# Patient Record
Sex: Female | Born: 1970 | Race: Black or African American | Hispanic: No | Smoking: Never smoker
Health system: Southern US, Community
[De-identification: ages and names within clinical notes are randomized; demographics above are authoritative.]

## PROBLEM LIST (undated history)

## (undated) DIAGNOSIS — D219 Benign neoplasm of connective and other soft tissue, unspecified: Secondary | ICD-10-CM

## (undated) DIAGNOSIS — F419 Anxiety disorder, unspecified: Secondary | ICD-10-CM

## (undated) DIAGNOSIS — G5600 Carpal tunnel syndrome, unspecified upper limb: Secondary | ICD-10-CM

## (undated) DIAGNOSIS — K759 Inflammatory liver disease, unspecified: Secondary | ICD-10-CM

## (undated) DIAGNOSIS — M1990 Unspecified osteoarthritis, unspecified site: Secondary | ICD-10-CM

## (undated) HISTORY — PX: ABDOMINAL HYSTERECTOMY: SHX81

---

## 2001-09-09 HISTORY — PX: TUBAL LIGATION: SHX77

## 2008-05-31 ENCOUNTER — Emergency Department (HOSPITAL_BASED_OUTPATIENT_CLINIC_OR_DEPARTMENT_OTHER): Admission: EM | Admit: 2008-05-31 | Discharge: 2008-05-31 | Payer: Self-pay | Admitting: Emergency Medicine

## 2009-08-19 ENCOUNTER — Emergency Department (HOSPITAL_BASED_OUTPATIENT_CLINIC_OR_DEPARTMENT_OTHER): Admission: EM | Admit: 2009-08-19 | Discharge: 2009-08-19 | Payer: Self-pay | Admitting: Emergency Medicine

## 2010-09-09 DIAGNOSIS — D219 Benign neoplasm of connective and other soft tissue, unspecified: Secondary | ICD-10-CM

## 2010-09-09 HISTORY — DX: Benign neoplasm of connective and other soft tissue, unspecified: D21.9

## 2011-06-05 ENCOUNTER — Other Ambulatory Visit: Payer: Self-pay | Admitting: Obstetrics and Gynecology

## 2011-06-14 ENCOUNTER — Encounter (HOSPITAL_COMMUNITY)
Admission: RE | Admit: 2011-06-14 | Discharge: 2011-06-14 | Disposition: A | Discharge status: Home / Self Care | Payer: Managed Care, Other (non HMO) | Source: Ambulatory Visit | Attending: Obstetrics and Gynecology | Admitting: Obstetrics and Gynecology

## 2011-06-14 ENCOUNTER — Encounter (HOSPITAL_COMMUNITY): Payer: Self-pay

## 2011-06-14 HISTORY — DX: Anxiety disorder, unspecified: F41.9

## 2011-06-14 HISTORY — DX: Benign neoplasm of connective and other soft tissue, unspecified: D21.9

## 2011-06-14 LAB — SURGICAL PCR SCREEN
MRSA, PCR: NEGATIVE
Staphylococcus aureus: NEGATIVE

## 2011-06-14 LAB — CBC
Platelets: 185 10*3/uL (ref 150–400)
RBC: 4.18 MIL/uL (ref 3.87–5.11)
RDW: 12.6 % (ref 11.5–15.5)
WBC: 4.4 10*3/uL (ref 4.0–10.5)

## 2011-06-14 NOTE — Patient Instructions (Addendum)
   Your procedure is scheduled on: Monday, October 8 at 7:30am  Enter through the [employer] of [facility] at: 6:00am Pick up the phone at the desk and dial [phone] and inform us of your arrival  Please call this number if you have any problems the morning of surgery: [phone]  Remember: Do not eat food after midnight  Sunday Do not drink clear liquids after midnight: Sunday Take these medicines the morning of surgery with a SIP OF WATER:  Do not wear jewelry, make-up, or FINGER nail polish Do not wear lotions, powders, or perfumes.  You may wear deodorant. Do not shave 48 hours prior to surgery. Do not bring valuables to the hospital. Leave suitcase in the car. After Surgery it may be brought to your room. For patients being admitted to the hospital, checkout time is 11:00am the day of discharge.  Patients discharged on the day of surgery will not be allowed to drive home.   Name and phone number of your driver:   Remember to use your hibiclens as instructed. Please shower with 1/2 bottle the evening before your surgery and the other 1/2 bottle the morning of surgery.

## 2011-06-16 ENCOUNTER — Other Ambulatory Visit (HOSPITAL_COMMUNITY): Payer: Self-pay | Admitting: Obstetrics and Gynecology

## 2011-06-16 DIAGNOSIS — D259 Leiomyoma of uterus, unspecified: Secondary | ICD-10-CM | POA: Insufficient documentation

## 2011-06-16 DIAGNOSIS — N946 Dysmenorrhea, unspecified: Secondary | ICD-10-CM | POA: Insufficient documentation

## 2011-06-16 DIAGNOSIS — N920 Excessive and frequent menstruation with regular cycle: Secondary | ICD-10-CM

## 2011-06-16 DIAGNOSIS — N760 Acute vaginitis: Secondary | ICD-10-CM

## 2011-06-16 DIAGNOSIS — B9689 Other specified bacterial agents as the cause of diseases classified elsewhere: Secondary | ICD-10-CM

## 2011-06-16 MED ORDER — METRONIDAZOLE IN NACL 5-0.79 MG/ML-% IV SOLN: 500.0000 mg | Freq: Three times a day (TID) | INTRAVENOUS | Status: DC

## 2011-06-16 MED ORDER — CEFAZOLIN SODIUM-DEXTROSE 2-3 GM-% IV SOLR
2.0000 g | INTRAVENOUS | Status: AC
  Administered 2011-06-17: 2 g via INTRAVENOUS
  Filled 2011-06-16: qty 50

## 2011-06-17 ENCOUNTER — Ambulatory Visit (HOSPITAL_COMMUNITY): Payer: Managed Care, Other (non HMO) | Admitting: Anesthesiology

## 2011-06-17 ENCOUNTER — Encounter (HOSPITAL_COMMUNITY): Payer: Self-pay | Admitting: Anesthesiology

## 2011-06-17 ENCOUNTER — Other Ambulatory Visit: Payer: Self-pay | Admitting: Obstetrics and Gynecology

## 2011-06-17 ENCOUNTER — Ambulatory Visit (HOSPITAL_COMMUNITY)
Admission: RE | Admit: 2011-06-17 | Discharge: 2011-06-17 | Disposition: A | Discharge status: Home / Self Care | Payer: Managed Care, Other (non HMO) | Source: Ambulatory Visit | Attending: Obstetrics and Gynecology | Admitting: Obstetrics and Gynecology

## 2011-06-17 ENCOUNTER — Encounter (HOSPITAL_COMMUNITY): Payer: Self-pay | Admitting: *Deleted

## 2011-06-17 ENCOUNTER — Encounter (HOSPITAL_COMMUNITY)
Admission: RE | Disposition: A | Discharge status: Home / Self Care | Payer: Self-pay | Source: Ambulatory Visit | Attending: Obstetrics and Gynecology

## 2011-06-17 DIAGNOSIS — Z01818 Encounter for other preprocedural examination: Secondary | ICD-10-CM | POA: Insufficient documentation

## 2011-06-17 DIAGNOSIS — D259 Leiomyoma of uterus, unspecified: Secondary | ICD-10-CM | POA: Insufficient documentation

## 2011-06-17 DIAGNOSIS — N946 Dysmenorrhea, unspecified: Secondary | ICD-10-CM | POA: Insufficient documentation

## 2011-06-17 DIAGNOSIS — Z01812 Encounter for preprocedural laboratory examination: Secondary | ICD-10-CM | POA: Insufficient documentation

## 2011-06-17 DIAGNOSIS — N920 Excessive and frequent menstruation with regular cycle: Secondary | ICD-10-CM | POA: Insufficient documentation

## 2011-06-17 DIAGNOSIS — Z90710 Acquired absence of both cervix and uterus: Secondary | ICD-10-CM

## 2011-06-17 LAB — CBC
HCT: 33.4 % — ABNORMAL LOW (ref 36.0–46.0)
MCH: 30 pg (ref 26.0–34.0)
MCHC: 33.8 g/dL (ref 30.0–36.0)
RDW: 12.6 % (ref 11.5–15.5)

## 2011-06-17 LAB — BASIC METABOLIC PANEL
BUN: 5 mg/dL — ABNORMAL LOW (ref 6–23)
Calcium: 8.6 mg/dL (ref 8.4–10.5)
GFR calc Af Amer: >90 mL/min (ref >90)
GFR calc non Af Amer: 79 mL/min — ABNORMAL LOW (ref >90)
Potassium: 4 mEq/L (ref 3.5–5.1)
Sodium: 133 mEq/L — ABNORMAL LOW (ref 135–145)

## 2011-06-17 LAB — PREGNANCY, URINE: Preg Test, Ur: NEGATIVE

## 2011-06-17 SURGERY — ROBOTIC ASSISTED TOTAL HYSTERECTOMY
Anesthesia: General | Site: Uterus | Wound class: Clean Contaminated

## 2011-06-17 MED ORDER — FLUOXETINE HCL 20 MG PO CAPS
20.0000 mg | ORAL_CAPSULE | Freq: Every day | ORAL | Status: DC
  Filled 2011-06-17 (×2): qty 1

## 2011-06-17 MED ORDER — HYDROCODONE-ACETAMINOPHEN 5-325 MG PO TABS: 1.0000 | ORAL_TABLET | ORAL | Status: DC | PRN

## 2011-06-17 MED ORDER — DEXAMETHASONE SODIUM PHOSPHATE 4 MG/ML IJ SOLN
INTRAMUSCULAR | Status: DC | PRN
  Administered 2011-06-17: 5 mg via INTRAVENOUS

## 2011-06-17 MED ORDER — KETOROLAC TROMETHAMINE 30 MG/ML IJ SOLN
INTRAMUSCULAR | Status: AC
  Filled 2011-06-17: qty 1

## 2011-06-17 MED ORDER — HYDROMORPHONE HCL 2 MG PO TABS
4.0000 mg | ORAL_TABLET | ORAL | Status: DC | PRN
  Administered 2011-06-17: 4 mg via ORAL
  Filled 2011-06-17: qty 2

## 2011-06-17 MED ORDER — SIMETHICONE 80 MG PO CHEW: 80.0000 mg | CHEWABLE_TABLET | Freq: Four times a day (QID) | ORAL | Status: DC | PRN

## 2011-06-17 MED ORDER — SCOPOLAMINE 1 MG/3DAYS TD PT72
MEDICATED_PATCH | TRANSDERMAL | Status: AC
  Administered 2011-06-17: 1.5 mg via TRANSDERMAL
  Filled 2011-06-17: qty 1

## 2011-06-17 MED ORDER — ROCURONIUM BROMIDE 50 MG/5ML IV SOLN
INTRAVENOUS | Status: AC
  Filled 2011-06-17: qty 1

## 2011-06-17 MED ORDER — SUFENTANIL CITRATE 50 MCG/ML IV SOLN
INTRAVENOUS | Status: AC
  Filled 2011-06-17: qty 1

## 2011-06-17 MED ORDER — ALUM & MAG HYDROXIDE-SIMETH 200-200-20 MG/5ML PO SUSP: 30.0000 mL | ORAL | Status: DC | PRN

## 2011-06-17 MED ORDER — SCOPOLAMINE 1 MG/3DAYS TD PT72
1.0000 | MEDICATED_PATCH | Freq: Once | TRANSDERMAL | Status: DC
  Administered 2011-06-17: 1.5 mg via TRANSDERMAL

## 2011-06-17 MED ORDER — HYDROMORPHONE HCL 1 MG/ML IJ SOLN
INTRAMUSCULAR | Status: AC
  Filled 2011-06-17: qty 1

## 2011-06-17 MED ORDER — LACTATED RINGERS IR SOLN
Status: DC | PRN
  Administered 2011-06-17: 3000 mL

## 2011-06-17 MED ORDER — ONDANSETRON HCL 4 MG/2ML IJ SOLN: 4.0000 mg | Freq: Four times a day (QID) | INTRAMUSCULAR | Status: DC | PRN

## 2011-06-17 MED ORDER — KETOROLAC TROMETHAMINE 30 MG/ML IJ SOLN
INTRAMUSCULAR | Status: DC | PRN
  Administered 2011-06-17: 30 mg via INTRAVENOUS

## 2011-06-17 MED ORDER — SENNOSIDES-DOCUSATE SODIUM 8.6-50 MG PO TABS: 2.0000 | ORAL_TABLET | Freq: Every day | ORAL | Status: DC | PRN

## 2011-06-17 MED ORDER — ALPRAZOLAM 0.25 MG PO TABS: 0.2500 mg | ORAL_TABLET | Freq: Every day | ORAL | Status: DC | PRN

## 2011-06-17 MED ORDER — LIDOCAINE HCL (CARDIAC) 20 MG/ML IV SOLN
INTRAVENOUS | Status: AC
  Filled 2011-06-17: qty 5

## 2011-06-17 MED ORDER — FENTANYL CITRATE 0.05 MG/ML IJ SOLN
INTRAMUSCULAR | Status: AC
  Filled 2011-06-17: qty 2

## 2011-06-17 MED ORDER — PROPOFOL 10 MG/ML IV EMUL
INTRAVENOUS | Status: AC
  Filled 2011-06-17: qty 20

## 2011-06-17 MED ORDER — PANTOPRAZOLE SODIUM 40 MG PO TBEC
40.0000 mg | DELAYED_RELEASE_TABLET | Freq: Every day | ORAL | Status: DC
  Filled 2011-06-17 (×2): qty 1

## 2011-06-17 MED ORDER — METRONIDAZOLE IN NACL 5-0.79 MG/ML-% IV SOLN
500.0000 mg | INTRAVENOUS | Status: AC
  Administered 2011-06-17 (×2): 500 mg via INTRAVENOUS
  Filled 2011-06-17: qty 100

## 2011-06-17 MED ORDER — BUPIVACAINE HCL (PF) 0.25 % IJ SOLN
INTRAMUSCULAR | Status: DC | PRN
  Administered 2011-06-17: 10 mL

## 2011-06-17 MED ORDER — DEXTROSE IN LACTATED RINGERS 5 % IV SOLN: INTRAVENOUS | Status: DC

## 2011-06-17 MED ORDER — NEOSTIGMINE METHYLSULFATE 1 MG/ML IJ SOLN
INTRAMUSCULAR | Status: AC
  Filled 2011-06-17: qty 10

## 2011-06-17 MED ORDER — ROCURONIUM BROMIDE 100 MG/10ML IV SOLN
INTRAVENOUS | Status: DC | PRN
  Administered 2011-06-17: 5 mg via INTRAVENOUS
  Administered 2011-06-17: 40 mg via INTRAVENOUS

## 2011-06-17 MED ORDER — ACETAMINOPHEN 10 MG/ML IV SOLN
1000.0000 mg | Freq: Once | INTRAVENOUS | Status: AC | PRN
  Administered 2011-06-17: 1000 mg via INTRAVENOUS
  Filled 2011-06-17: qty 100

## 2011-06-17 MED ORDER — BISACODYL 10 MG RE SUPP: 10.0000 mg | Freq: Every day | RECTAL | Status: DC | PRN

## 2011-06-17 MED ORDER — SUFENTANIL CITRATE 50 MCG/ML IV SOLN
INTRAVENOUS | Status: DC | PRN
  Administered 2011-06-17: 10 ug via INTRAVENOUS
  Administered 2011-06-17: 5 ug via INTRAVENOUS
  Administered 2011-06-17 (×2): 10 ug via INTRAVENOUS

## 2011-06-17 MED ORDER — MENTHOL 3 MG MT LOZG: 1.0000 | LOZENGE | OROMUCOSAL | Status: DC | PRN

## 2011-06-17 MED ORDER — NEOSTIGMINE METHYLSULFATE 1 MG/ML IJ SOLN
INTRAMUSCULAR | Status: DC | PRN
  Administered 2011-06-17: 3 mg via INTRAVENOUS

## 2011-06-17 MED ORDER — MIDAZOLAM HCL 5 MG/5ML IJ SOLN
INTRAMUSCULAR | Status: DC | PRN
  Administered 2011-06-17: .5 mg via INTRAVENOUS
  Administered 2011-06-17: 1.5 mg via INTRAVENOUS

## 2011-06-17 MED ORDER — OXYCODONE-ACETAMINOPHEN 5-325 MG PO TABS
1.0000 | ORAL_TABLET | ORAL | Status: DC | PRN
  Administered 2011-06-17: 2 via ORAL
  Filled 2011-06-17: qty 2

## 2011-06-17 MED ORDER — MIDAZOLAM HCL 2 MG/2ML IJ SOLN
INTRAMUSCULAR | Status: AC
  Filled 2011-06-17: qty 2

## 2011-06-17 MED ORDER — PHENYLEPHRINE HCL 10 MG/ML IJ SOLN
INTRAMUSCULAR | Status: DC | PRN
  Administered 2011-06-17: 80 mg via INTRAVENOUS
  Administered 2011-06-17: 40 mg via INTRAVENOUS
  Administered 2011-06-17: 80 mg via INTRAVENOUS

## 2011-06-17 MED ORDER — FENTANYL CITRATE 0.05 MG/ML IJ SOLN
25.0000 ug | INTRAMUSCULAR | Status: DC | PRN
  Administered 2011-06-17 (×2): 25 ug via INTRAVENOUS

## 2011-06-17 MED ORDER — METRONIDAZOLE 500 MG PO TABS
500.0000 mg | ORAL_TABLET | Freq: Two times a day (BID) | ORAL | Status: DC
  Filled 2011-06-17 (×2): qty 1

## 2011-06-17 MED ORDER — ZOLPIDEM TARTRATE 5 MG PO TABS: 5.0000 mg | ORAL_TABLET | Freq: Every evening | ORAL | Status: DC | PRN

## 2011-06-17 MED ORDER — ONDANSETRON HCL 4 MG/2ML IJ SOLN
INTRAMUSCULAR | Status: AC
  Filled 2011-06-17: qty 2

## 2011-06-17 MED ORDER — LACTATED RINGERS IV SOLN
INTRAVENOUS | Status: DC
  Administered 2011-06-17 (×3): via INTRAVENOUS

## 2011-06-17 MED ORDER — DOCUSATE SODIUM 100 MG PO CAPS: 100.0000 mg | ORAL_CAPSULE | Freq: Every day | ORAL | Status: DC

## 2011-06-17 MED ORDER — PROPOFOL 10 MG/ML IV EMUL
INTRAVENOUS | Status: DC | PRN
  Administered 2011-06-17: 100 mg via INTRAVENOUS

## 2011-06-17 MED ORDER — CLINDAMYCIN HCL 300 MG PO CAPS: 300.0000 mg | ORAL_CAPSULE | Freq: Two times a day (BID) | ORAL | Status: AC

## 2011-06-17 MED ORDER — HYDROMORPHONE HCL 1 MG/ML IJ SOLN
INTRAMUSCULAR | Status: DC | PRN
  Administered 2011-06-17 (×2): .5 mg via INTRAVENOUS

## 2011-06-17 MED ORDER — GLYCOPYRROLATE 0.2 MG/ML IJ SOLN
INTRAMUSCULAR | Status: DC | PRN
  Administered 2011-06-17: .4 mg via INTRAVENOUS

## 2011-06-17 MED ORDER — LIDOCAINE HCL (CARDIAC) 20 MG/ML IV SOLN
INTRAVENOUS | Status: DC | PRN
  Administered 2011-06-17: 60 mg via INTRAVENOUS

## 2011-06-17 MED ORDER — CLINDAMYCIN HCL 300 MG PO CAPS
300.0000 mg | ORAL_CAPSULE | Freq: Two times a day (BID) | ORAL | Status: DC
  Filled 2011-06-17: qty 1

## 2011-06-17 MED ORDER — GLYCOPYRROLATE 0.2 MG/ML IJ SOLN
INTRAMUSCULAR | Status: AC
  Filled 2011-06-17: qty 2

## 2011-06-17 MED ORDER — ONDANSETRON HCL 4 MG PO TABS: 4.0000 mg | ORAL_TABLET | Freq: Four times a day (QID) | ORAL | Status: DC | PRN

## 2011-06-17 MED ORDER — DEXAMETHASONE SODIUM PHOSPHATE 10 MG/ML IJ SOLN
INTRAMUSCULAR | Status: AC
  Filled 2011-06-17: qty 1

## 2011-06-17 MED ORDER — METRONIDAZOLE IN NACL 5-0.79 MG/ML-% IV SOLN
500.0000 mg | Freq: Three times a day (TID) | INTRAVENOUS | Status: DC
  Filled 2011-06-17 (×5): qty 100

## 2011-06-17 MED ORDER — ONDANSETRON HCL 4 MG/2ML IJ SOLN
INTRAMUSCULAR | Status: DC | PRN
  Administered 2011-06-17: 2 mg via INTRAVENOUS

## 2011-06-17 MED ORDER — HYDROMORPHONE HCL 4 MG PO TABS: 4.0000 mg | ORAL_TABLET | ORAL | Status: AC | PRN

## 2011-06-17 SURGICAL SUPPLY — 53 items
BAG URINE DRAINAGE (UROLOGICAL SUPPLIES) ×3 IMPLANT
BARRIER ADHS 3X4 INTERCEED (GAUZE/BANDAGES/DRESSINGS) ×3 IMPLANT
CABLE HIGH FREQUENCY MONO STRZ (ELECTRODE) ×3 IMPLANT
CATH FOLEY 3WAY  5CC 16FR (CATHETERS) ×1
CATH FOLEY 3WAY 5CC 16FR (CATHETERS) ×2 IMPLANT
CHLORAPREP W/TINT 26ML (MISCELLANEOUS) ×9 IMPLANT
CONT PATH 16OZ SNAP LID 3702 (MISCELLANEOUS) ×3 IMPLANT
COVER MAYO STAND STRL (DRAPES) ×3 IMPLANT
COVER TABLE BACK 60X90 (DRAPES) ×6 IMPLANT
COVER TIP SHEARS 8 DVNC (MISCELLANEOUS) ×2 IMPLANT
COVER TIP SHEARS 8MM DA VINCI (MISCELLANEOUS) ×1
DECANTER SPIKE VIAL GLASS SM (MISCELLANEOUS) ×3 IMPLANT
DERMABOND ADVANCED (GAUZE/BANDAGES/DRESSINGS) ×1
DERMABOND ADVANCED .7 DNX12 (GAUZE/BANDAGES/DRESSINGS) ×2 IMPLANT
DRAPE HUG U DISPOSABLE (DRAPE) ×3 IMPLANT
DRAPE LG THREE QUARTER DISP (DRAPES) ×6 IMPLANT
DRAPE MONITOR DA VINCI (DRAPE) ×3 IMPLANT
DRAPE WARM FLUID 44X44 (DRAPE) ×3 IMPLANT
ELECT REM PT RETURN 9FT ADLT (ELECTROSURGICAL) ×3
ELECTRODE REM PT RTRN 9FT ADLT (ELECTROSURGICAL) ×2 IMPLANT
EVACUATOR SMOKE 8.L (FILTER) ×3 IMPLANT
GLOVE BIO SURGEON STRL SZ 6.5 (GLOVE) ×6 IMPLANT
GLOVE BIOGEL PI IND STRL 7.0 (GLOVE) ×10 IMPLANT
GLOVE BIOGEL PI INDICATOR 7.0 (GLOVE) ×5
GOWN PREVENTION PLUS LG XLONG (DISPOSABLE) ×6 IMPLANT
GYRUS RUMI II 3.5CM BLUE (DISPOSABLE) ×3
GYRUS RUMI II 4.0CM BLUE (DISPOSABLE) ×3
HEMOSTAT SURGICEL 2X14 (HEMOSTASIS) ×3 IMPLANT
KIT DISP ACCESSORY 4 ARM (KITS) ×3 IMPLANT
NEEDLE INSUFFLATION 14GA 120MM (NEEDLE) ×3 IMPLANT
NS IRRIG 1000ML POUR BTL (IV SOLUTION) ×9 IMPLANT
PACK LAVH (CUSTOM PROCEDURE TRAY) ×3 IMPLANT
PAD PREP 24X48 CUFFED NSTRL (MISCELLANEOUS) ×6 IMPLANT
RUMI II GYRUS 3.5CM BLUE (DISPOSABLE) ×2 IMPLANT
RUMI II GYRUS 4.0CM BLUE (DISPOSABLE) ×2 IMPLANT
SCISSORS LAP 5X35 DISP (ENDOMECHANICALS) IMPLANT
SET IRRIG TUBING LAPAROSCOPIC (IRRIGATION / IRRIGATOR) ×3 IMPLANT
SHEET LAVH (DRAPES) ×3 IMPLANT
SOLUTION ELECTROLUBE (MISCELLANEOUS) ×3 IMPLANT
SUT VIC AB 0 CT1 27 (SUTURE) ×5
SUT VIC AB 0 CT1 27XBRD ANTBC (SUTURE) ×10 IMPLANT
SUT VIC AB 0 CT2 27 (SUTURE) ×9 IMPLANT
SUT VICRYL 0 UR6 27IN ABS (SUTURE) ×3 IMPLANT
SUT VICRYL 4-0 PS2 18IN ABS (SUTURE) ×6 IMPLANT
SYR 50ML LL SCALE MARK (SYRINGE) ×3 IMPLANT
TIP UTERINE 6.7X6CM WHT DISP (MISCELLANEOUS) ×3 IMPLANT
TOWEL OR 17X24 6PK STRL BLUE (TOWEL DISPOSABLE) ×9 IMPLANT
TROCAR DISP BLADELESS 8 DVNC (TROCAR) ×2 IMPLANT
TROCAR DISP BLADELESS 8MM (TROCAR) ×1
TROCAR XCEL NON-BLD 5MMX100MML (ENDOMECHANICALS) ×3 IMPLANT
TROCAR Z-THREAD 12X150 (TROCAR) ×3 IMPLANT
TUBING FILTER THERMOFLATOR (ELECTROSURGICAL) ×3 IMPLANT
WARMER LAPAROSCOPE (MISCELLANEOUS) ×3 IMPLANT

## 2011-06-17 NOTE — Progress Notes (Signed)
Pt in 3rd floor recovery lounge (Room 302) for 30 minutes.  Pt c/o pain 6/10 in lower abdomen unrelieved by 1000 mg IV acetaminophen and fentanyl - both given in PACU.  Vicodin and Percocet ordered for pt post-op.  Phoned Dr Cherly Hensen to ask about a pain medication without acetaminophen.  Dr Cherly Hensen stated the Percocet would be fine and to administer as needed.  Phoned Mariann Barter, RPh in pharmacy to ask about acetaminophen dosing.  He stated 4 gram limit in 24 hours and generally no more than 1 gram in 6 hours.  Also stated that if pt has a healthy liver and MD is aware of dosing, then probably OK to proceed with medication as ordered - being sure to keep track of 24 hour limit.

## 2011-06-17 NOTE — Anesthesia Preprocedure Evaluation (Addendum)
Anesthesia Evaluation  Name, MR# and DOB Patient awake  General Assessment Comment  Reviewed: Allergy & Precautions, H&P , NPO status , Patient's Chart, lab work & pertinent test results  Airway Mallampati: I TM Distance: >3 FB Neck ROM: full    Dental No notable dental hx. (+) Teeth Intact   Pulmonary    Pulmonary exam normal       Cardiovascular     Neuro/Psych PSYCHIATRIC DISORDERS Anxiety Negative Neurological ROS     GI/Hepatic negative GI ROS Neg liver ROS    Endo/Other  Negative Endocrine ROS  Renal/GU negative Renal ROS  Genitourinary negative   Musculoskeletal negative musculoskeletal ROS (+)   Abdominal Normal abdominal exam  (+)   Peds  Hematology negative hematology ROS (+)   Anesthesia Other Findings   Reproductive/Obstetrics negative OB ROS                           Anesthesia Physical Anesthesia Plan  ASA: II  Anesthesia Plan: General   Post-op Pain Management:    Induction: Intravenous  Airway Management Planned: Oral ETT  Additional Equipment:   Intra-op Plan:   Post-operative Plan: Extubation in OR  Informed Consent: I have reviewed the patients History and Physical, chart, labs and discussed the procedure including the risks, benefits and alternatives for the proposed anesthesia with the patient or authorized representative who has indicated his/her understanding and acceptance.   Dental Advisory Given  Plan Discussed with: CRNA  Anesthesia Plan Comments:        Anesthesia Quick Evaluation

## 2011-06-17 NOTE — Transfer of Care (Signed)
Immediate Anesthesia Transfer of Care Note  Patient: Sylvia Acevedo  Procedure(s) Performed:  ROBOTIC ASSISTED TOTAL HYSTERECTOMY; BILATERAL SALPINGECTOMY  Patient Location: PACU  Anesthesia Type: General  Level of Consciousness: awake, alert  and oriented  Airway & Oxygen Therapy: Patient Spontanous Breathing and Patient connected to nasal cannula oxygen  Post-op Assessment: Report given to PACU RN, Post -op Vital signs reviewed and stable and Patient moving all extremities X 4  Post vital signs: Reviewed and stable  Complications: No apparent anesthesia complications

## 2011-06-17 NOTE — Progress Notes (Signed)
Pt discharged to home with mother and sister.    Condition stable.  Pt to car via wheelchair with Fulton Reek, NT.  No equipment ordered at discharge.

## 2011-06-17 NOTE — Brief Op Note (Signed)
06/17/2011  11:36 AM  PATIENT:  Sylvia Acevedo  40 y.o. female  PRE-OPERATIVE DIAGNOSIS:  Menorrhagia, Uterine Fibroids, dysmenorrhea  POST-OPERATIVE DIAGNOSIS:  Menorrhagia, Uterine Fibroids, dysmenorrhea  PROCEDURE:  Procedure(s): DaVinci ROBOTIC ASSISTED TOTAL HYSTERECTOMY BILATERAL SALPINGECTOMY  SURGEON:  Surgeon(s): Serita Kyle, MD Alfredia Ferguson Mody  PHYSICIAN ASSISTANT:   ASSISTANTS: Vaishali mody, MD  ANESTHESIA:   general  OR FLUID I/O:  Total I/O In: 1400 [I.V.:1400] Out: 275 [Urine:200; Blood:75]  BLOOD ADMINISTERED:none  DRAINS: none   LOCAL MEDICATIONS USED:  OTHER marcaine 7 cc  Findings: fibroid uterus w/ large left 6-7 cm pedunculated fundal fibroid, surgically separated tubes bilat, nl ovaries ( left one w/ CLC, nl appendix, nl liver edge, no evidence of endometriosis, nl ureters bilaterally  SPECIMEN:  Source of Specimen:  uterus w. cervix and tubes  DISPOSITION OF SPECIMEN:  PATHOLOGY  COUNTS:  YES  TOURNIQUET:  * No tourniquets in log *  DICTATION: .Other Dictation: Dictation Number [id number]  PLAN OF CARE: Discharge to home after PACU  PATIENT DISPOSITION:  PACU - hemodynamically stable.   Delay start of Pharmacological VTE agent (>24hrs) due to surgical blood loss or risk of bleeding:  no

## 2011-06-17 NOTE — Anesthesia Postprocedure Evaluation (Signed)
Anesthesia Post Note  Patient: Sylvia Acevedo  Procedure(s) Performed:  ROBOTIC ASSISTED TOTAL HYSTERECTOMY; BILATERAL SALPINGECTOMY  Anesthesia type: General  Patient location: PACU  Post pain: Pain level controlled  Post assessment: Post-op Vital signs reviewed  Last Vitals:  Filed Vitals:   06/17/11 1300  BP:   Pulse:   Temp:   Resp: 16    Post vital signs: Reviewed  Level of consciousness: sedated  Complications: No apparent anesthesia complications

## 2011-06-17 NOTE — Preoperative (Signed)
Beta Blockers   Reason not to administer Beta Blockers:Not Applicable 

## 2011-06-17 NOTE — Discharge Summary (Signed)
Physician Discharge Summary  Patient ID: Sylvia Acevedo MRN: [id number] DOB/AGE: [birth year] 40 y.o.  Admit date: 06/17/2011 Discharge date: 06/17/2011  Admission Diagnoses: menorrhagia, dysmenorrhea, uterine fibroid  Discharge Diagnoses: menorrhagia, dysmenorrhea, uterine fibroid Active Problems:  * No active hospital problems. *   Procedure: DaVinci TLH, bilateral salpingectomy Discharged Condition: stable  Hospital Course: uncomplicated postoperative course.  Consults: none  Significant Diagnostic Studies: labs:  hct 33.4, hgb 11.3 wbc 11.8 plt 133k  creatinine 0.91 Treatments: surgery: Davinci robotic TLH, bilateral salpingectomy  Discharge Exam: Blood pressure 109/73, pulse 89, temperature 98 F (36.7 C), temperature source Oral, resp. rate 18, height 5\' 2"  (1.575 m), weight 129 lb (58.514 kg), SpO2 100.00%. General appearance: alert, cooperative and no distress Resp: clear to auscultation bilaterally Cardio: regular rate and rhythm, S1, S2 normal, no murmur, click, rub or gallop GI: soft, non-tender; bowel sounds normal; no masses,  no organomegaly and incision dry/intact/clean Pelvic: deferred Extremities: no edema, redness or tenderness in the calves or thighs  Disposition: Final discharge disposition not confirmed  Discharge Orders    Future Orders Please Complete By Expires   Diet general      Increase activity slowly      Discharge instructions      Comments:   Call if temperature greater than equal to 100.4, nothing per vagina for 4-6 weeks or severe nausea vomiting, increased incisional pain , drainage or redness in the incision site, no straining with bowel movements, showers no bath  No straining with bowel movement   Leave dressing on - Keep it clean, dry, and intact until clinic visit      May walk up steps        Current Discharge Medication List    START taking these medications   Details  clindamycin (CLEOCIN) 300 MG capsule Take 1 capsule (300 mg  total) by mouth 2 times daily at 12 noon and 4 pm. Qty: 14 capsule, Refills: 0    HYDROmorphone (DILAUDID) 4 MG tablet Take 1 tablet (4 mg total) by mouth every 4 (four) hours as needed. Qty: 30 tablet, Refills: 0      CONTINUE these medications which have NOT CHANGED   Details  ALPRAZolam (XANAX) 0.5 MG tablet Take 0.25 mg by mouth daily as needed. For anxiety     FLUoxetine (PROZAC) 20 MG capsule Take 20 mg by mouth daily.      traMADol (ULTRAM) 50 MG tablet Take 50 mg by mouth every 6 (six) hours as needed. For pain       STOP taking these medications     HYDROcodone-acetaminophen (VICODIN) 5-500 MG per tablet        Follow-up Information    Follow up with Mehul Rudin A, MD in 2 weeks.   Contact information:   [address] [place] [address] [phone]          Signed: Serita Kyle 06/17/2011, 6:16 PM

## 2011-06-18 NOTE — Op Note (Signed)
NAMEELORAH, DEWING               ACCOUNT NO.:  [id number]  MEDICAL RECORD NO.:  [id number]  LOCATION:  9302                          FACILITY:  [facility]  PHYSICIAN:  Maxie Better, M.D.DATE OF BIRTH:  [birth year]  DATE OF PROCEDURE:  06/17/2011 DATE OF DISCHARGE:  06/17/2011                              OPERATIVE REPORT   PREOPERATIVE DIAGNOSES: 1. Menorrhagia. 2. Uterine fibroids. 3. Dysmenorrhea.  PROCEDURES: 1. Da Vinci robotic total hysterectomy. 2. Bilateral salpingectomy.  POSTOPERATIVE DIAGNOSES: 1. Menorrhagia. 2. Uterine fibroids. 3. Dysmenorrhea.  ANESTHESIA:  General.  SURGEON:  Maxie Better, MD  ASSISTANT:  Darryl Nestle, MD  DESCRIPTION OF PROCEDURE:  Under adequate general anesthesia, the patient was placed in the dorsal lithotomy position.  Examination under anesthesia revealed about a 10-12-week size uterus with a palpable mass in the left fundal area.  The patient was positioned for robotic surgery.  She was sterilely prepped with ChloraPrep and Betadine. Indwelling Foley catheter was sterilely placed.  She was draped.  A weighted speculum was placed in the vagina.  Sims retractors used anteriorly.  The cervix was large, parous, and the anterior-posterior lip of the cervix was grasped and single figure-of-eight sutures was placed in the anterior posterior lip, the uterus sounded to 7 cm.  The measurements of the cervix corresponded to 3.5 KOH ring.  A #6 introducer with the KOH ring was inserted.  Balloon inflated, however, the ring after placement was not seated well around the cervix and was increased to the 4.0 KOH RUMI ring.  The weighted speculum and retractor were then removed.  Attention was then turned to the abdomen.  A vertical supraumbilical incision was made.  Veress needle was introduced, tested with normal saline.  Carbon dioxide was insufflated after opening pressure of 4 was noted.  A 3 L of CO2 was insufflated. Veress needle  was then removed.  A 12-mm disposable trocar with sleeve was introduced in to the abdomen without incident.  The robotic camera was then inserted through that site.  Pelvis was inspected.  Upper liver edge was normal.  Appendix was noted to be normal.  Uterus was enlarged with at least 6-cm pedunculated left fundal fibroid.  The patient was placed in Trendelenburg.  The both tubes were evidence of prior surgical interruption and ovaries were normal bilaterally.  No endometriosis in the anterior or posterior cul-de-sac noted.  At that point, decision was then made to proceed with a robotic surgery.  Two 8-mm size ports were placed on the left and spread away from each other and slightly medial to the iliac crest.  On the right hand side, an 8-mm port was placed and a 5-mm assistant port was placed, all of which under direct visualization.  At that point, the robot was brought to these port sites and attached to the robotic instruments.  The PK, a Pro grasper, and monopolar scissors were then inserted and placed in the midline.  I then went to the surgical console, starting on using the third arm with a Pro grasper.  The uterus with the fibroid was deviated to the patient's right.  The utero-ovarian ligament on the right was serially clamped, cauterized, and cut as  well as the round ligament on that side, carried down to where the bladder peritoneum was noted and anterior leaf of the bladder was partially opened on that side.  The uterine vessels were subsequently noted, cauterized, but not cut.  The uterus was then placed in the midline.  The anterior cul-de-sac and the RUMI ring was placed, such that the bladder could be seen.  A vesicouterine peritoneum was opened transversely and blunt dissection was then performed.  The uterus was then shifted to the opposite side, where there was evidence of shortened right uteroovarian ligament.  The round ligament was first cauterized and then  subsequently cut, opening further the vesicouterine peritoneum on the right-hand side, and the bladder was then sharply dissected off the lower uterine segment and off the KOH ring and displaced inferiorly.  The right uteroovarian ligament then serially clamped, cauterized, and cut, carried down to the level of the uterine vessels on the right, was then serially clamped, cauterized, and ultimately cut.  Once the vessels were secured on the right, part of the anterior vagina was then opened and carried around to the right-hand side.  Attention was then turned back to the left.  Further cauterization of the uterine vessels was continued, subsequently that area was opened sharply and the cervix was subsequently detached from its vaginal attachment circumferentially.  Once this was done, the uterus with the fibroids were pulled through the vagina.  The specimen weighed 255 g.  The vaginal cuff was inspected, bleeders cauterized. Subsequently, the vaginal cuff was closed with interrupted 0 Vicryl figure-of-eight sutures.  Attention was then turned back to the adnexas. Both fallopian tubes were planned to be removed, starting on the left- hand side under the mesosalpinx, that area was clamped, cauterized, and then subsequently cut serially until the left tube was removed.  The same procedure was performed on the contralateral side.  Both ureters were seen to be peristalsing nicely during the case and and at end of case.  The tubes were then removed through the ports.  Abdomen was copiously irrigated and small bleeders cauterized with good hemostasis noted. Decision was then made to undock the robot.  The pelvis was then again inspected, irrigated, suctioned, small bleeders cauterized, and Surgicel was placed at the vaginal cuff.  Both adnexa were otherwise unremarkable.  At that point, the ports were removed under direct visualization.  The supraumbilical port was then removed and 12-mm  site was closed with facial stitches 0 Vicryl and the incision was closed with subcuticular 4-0 Vicryl sutures followed by Dermabond on all sites. The bimanual exam was done at the end of the case to confirm that the vaginal cuff was intact and well approximated, and it was.  SPECIMENS:  The uterus with fibroid and both fallopian tubes sent to Pathology.  ESTIMATED BLOOD LOSS:  75 mL.  INTRAOPERATIVE FLUID:  1400 mL.  URINE OUTPUT:  200 mL.  Sponge and instrument counts x2 was correct.  COMPLICATION:  None.  The patient tolerated the procedure well and was transferred to recovery room in stable condition.     Maxie Better, M.D.     [place]/MEDQ  D:  06/17/2011  T:  06/18/2011  Job:  [id number]

## 2011-06-19 NOTE — Progress Notes (Signed)
Encounter addended by: Jo-Ann Johanning C Houa Nie on: 06/19/2011  1:09 PM<BR>     Documentation filed: Notes Section

## 2011-06-19 NOTE — Anesthesia Postprocedure Evaluation (Incomplete)
  Anesthesia Post-op Note  Patient: Sylvia Acevedo  Procedure(s) Performed:  ROBOTIC ASSISTED TOTAL HYSTERECTOMY; BILATERAL SALPINGECTOMY  Patient Location: {PLACES; ANE POST:19477::"PACU"}  Anesthesia Type: {PROCEDURES; ANE POST ANESTHESIA TYPE:19480}  Level of Consciousness: {FINDINGS; ANE POST LEVEL OF CONSCIOUSNESS:19484}  Airway and Oxygen Therapy: {Exam; oxygen device:30095}  Post-op Pain: {Desc; pain severity:12299}  Post-op Assessment: {ASSESSMENT; ANE ZOXW:96045}  Post-op Vital Signs: {DESC; ANE POST WUJWJX:91478}  Complications: {FINDINGS; ANE POST COMPLICATIONS:19485}

## 2011-10-31 ENCOUNTER — Encounter (HOSPITAL_BASED_OUTPATIENT_CLINIC_OR_DEPARTMENT_OTHER): Payer: Self-pay

## 2011-10-31 ENCOUNTER — Emergency Department (HOSPITAL_BASED_OUTPATIENT_CLINIC_OR_DEPARTMENT_OTHER)
Admission: EM | Admit: 2011-10-31 | Discharge: 2011-10-31 | Disposition: A | Discharge status: Home / Self Care | Payer: Managed Care, Other (non HMO) | Attending: Emergency Medicine | Admitting: Emergency Medicine

## 2011-10-31 DIAGNOSIS — F411 Generalized anxiety disorder: Secondary | ICD-10-CM | POA: Insufficient documentation

## 2011-10-31 DIAGNOSIS — R110 Nausea: Secondary | ICD-10-CM | POA: Insufficient documentation

## 2011-10-31 MED ORDER — SODIUM CHLORIDE 0.9 % IV SOLN
INTRAVENOUS | Status: AC
  Filled 2011-10-31: qty 50

## 2011-10-31 MED ORDER — DEXAMETHASONE SODIUM PHOSPHATE 10 MG/ML IJ SOLN
10.0000 mg | Freq: Once | INTRAMUSCULAR | Status: AC
  Administered 2011-10-31: 10 mg via INTRAVENOUS
  Filled 2011-10-31: qty 1

## 2011-10-31 MED ORDER — SODIUM CHLORIDE 0.9 % IV BOLUS (SEPSIS)
500.0000 mL | Freq: Once | INTRAVENOUS | Status: AC
  Administered 2011-10-31: 500 mL via INTRAVENOUS

## 2011-10-31 MED ORDER — DIPHENHYDRAMINE HCL 50 MG/ML IJ SOLN
25.0000 mg | Freq: Once | INTRAMUSCULAR | Status: AC
  Administered 2011-10-31: 25 mg via INTRAVENOUS
  Filled 2011-10-31: qty 1

## 2011-10-31 MED ORDER — METOCLOPRAMIDE HCL 5 MG/ML IJ SOLN
10.0000 mg | Freq: Once | INTRAMUSCULAR | Status: AC
  Administered 2011-10-31: 10 mg via INTRAVENOUS
  Filled 2011-10-31: qty 2

## 2011-10-31 NOTE — ED Notes (Signed)
Pt c/o migraine since fri night. Pt was seen by PMD on mon given medications without relief. Pt c/o tightness down right side of neck, pain in right posterior head, blurred vision and migraines. Pt states symptoms are consistent with previous migraines.

## 2011-10-31 NOTE — ED Provider Notes (Signed)
History     CSN: [id number]  Arrival date & time 10/31/11  1903   First MD Initiated Contact with Patient 10/31/11 2052      Chief Complaint  Patient presents with  . Migraine    (Consider location/radiation/quality/duration/timing/severity/associated sxs/prior treatment) HPI Comments: Patient presents with migraine for the last 6 days. She was seen by her primary care provider 4 days ago and given a shot of Toradol and another injection but she is unsure of. She also was taking Phenergan and Relpax at home. With no relief in symptoms. It started mildly and practically got worse over the last 6 days. Primarily in the posterior aspect of her head. She also has some muscle tightness across her neck. And upper shoulders. Tonight he fevers. She has some nausea but no vomiting. She does have photophobia. Her headache feels similar to her past migraines. There is no unusual symptoms related to them. No recent head injuries.  Patient is a 41 y.o. female presenting with migraine. The history is provided by the patient.  Migraine This is a recurrent problem. The current episode started more than 2 days ago. Associated symptoms include headaches. Pertinent negatives include no chest pain, no abdominal pain and no shortness of breath.    Past Medical History  Diagnosis Date  . Anxiety   . Fibroid 2012    on stem of fallopian tube  . Migraine     Past Surgical History  Procedure Date  . Tubal ligation 2003  . Abdominal hysterectomy     No family history on file.  History  Substance Use Topics  . Smoking status: Never Smoker   . Smokeless tobacco: Never Used  . Alcohol Use: 0.0 oz/week    0 Glasses of wine per week    OB History    Grav Para Term Preterm Abortions TAB SAB Ect Mult Living                  Review of Systems  Constitutional: Negative for fever, chills, diaphoresis and fatigue.  HENT: Negative for congestion, rhinorrhea and sneezing.   Eyes: Negative.     Respiratory: Negative for cough, chest tightness and shortness of breath.   Cardiovascular: Negative for chest pain and leg swelling.  Gastrointestinal: Positive for nausea. Negative for vomiting, abdominal pain, diarrhea and blood in stool.  Genitourinary: Negative for frequency, hematuria, flank pain and difficulty urinating.  Musculoskeletal: Negative for back pain and arthralgias.  Skin: Negative for rash.  Neurological: Positive for headaches. Negative for dizziness, speech difficulty, weakness and numbness.    Allergies  Aspirin and Latex  Home Medications   Current Outpatient Rx  Name Route Sig Dispense Refill  . ALPRAZOLAM 0.5 MG PO TABS Oral Take 0.25 mg by mouth daily as needed. For anxiety    . RELPAX PO Oral Take 1 tablet by mouth every 4 (four) hours as needed. For migraine    . TRAMADOL HCL 50 MG PO TABS Oral Take 50 mg by mouth every 6 (six) hours as needed. For pain       BP 114/75  Pulse 86  Temp(Src) 98.3 F (36.8 C) (Oral)  Resp 18  Ht 5\' 2"  (1.575 m)  Wt 135 lb (61.236 kg)  BMI 24.69 kg/m2  SpO2 100%  Physical Exam  Constitutional: She is oriented to person, place, and time. She appears well-developed and well-nourished.  HENT:  Head: Normocephalic and atraumatic.  Eyes: Pupils are equal, round, and reactive to light.  Normal funduscopic exam  Neck: Normal range of motion. Neck supple.  Cardiovascular: Normal rate, regular rhythm and normal heart sounds.   Pulmonary/Chest: Effort normal and breath sounds normal. No respiratory distress. She has no wheezes. She has no rales. She exhibits no tenderness.  Abdominal: Soft. Bowel sounds are normal. There is no tenderness. There is no rebound and no guarding.  Musculoskeletal: Normal range of motion. She exhibits no edema.  Lymphadenopathy:    She has no cervical adenopathy.  Neurological: She is alert and oriented to person, place, and time. She has normal strength. No cranial nerve deficit or sensory  deficit. Coordination normal. GCS eye subscore is 4. GCS verbal subscore is 5. GCS motor subscore is 6.  Skin: Skin is warm and dry. No rash noted.  Psychiatric: She has a normal mood and affect.    ED Course  Procedures (including critical care time)  Labs Reviewed - No data to display No results found.   1. Migraine       MDM  Pt feeling much better after migraine cocktail.  H/A similar to past headaches.  No unusual symptoms to suggest ICH/meningitis.        Rolan Bucco, MD 10/31/11 2211

## 2011-10-31 NOTE — Discharge Instructions (Signed)

## 2011-10-31 NOTE — ED Notes (Signed)
Migraine x 6 days-was seen PCP 4 days ago-given toradol and another inj-unsure of med-rx phenergan and relpax

## 2013-03-24 ENCOUNTER — Emergency Department (HOSPITAL_BASED_OUTPATIENT_CLINIC_OR_DEPARTMENT_OTHER): Payer: Managed Care, Other (non HMO)

## 2013-03-24 ENCOUNTER — Emergency Department (HOSPITAL_BASED_OUTPATIENT_CLINIC_OR_DEPARTMENT_OTHER)
Admission: EM | Admit: 2013-03-24 | Discharge: 2013-03-24 | Disposition: A | Discharge status: Home / Self Care | Payer: Managed Care, Other (non HMO) | Attending: Emergency Medicine | Admitting: Emergency Medicine

## 2013-03-24 ENCOUNTER — Encounter (HOSPITAL_BASED_OUTPATIENT_CLINIC_OR_DEPARTMENT_OTHER): Payer: Self-pay

## 2013-03-24 DIAGNOSIS — H538 Other visual disturbances: Secondary | ICD-10-CM | POA: Insufficient documentation

## 2013-03-24 DIAGNOSIS — IMO0002 Reserved for concepts with insufficient information to code with codable children: Secondary | ICD-10-CM | POA: Insufficient documentation

## 2013-03-24 DIAGNOSIS — Y929 Unspecified place or not applicable: Secondary | ICD-10-CM | POA: Insufficient documentation

## 2013-03-24 DIAGNOSIS — G43909 Migraine, unspecified, not intractable, without status migrainosus: Secondary | ICD-10-CM | POA: Insufficient documentation

## 2013-03-24 DIAGNOSIS — Z91040 Latex allergy status: Secondary | ICD-10-CM | POA: Insufficient documentation

## 2013-03-24 DIAGNOSIS — F411 Generalized anxiety disorder: Secondary | ICD-10-CM | POA: Insufficient documentation

## 2013-03-24 DIAGNOSIS — Z8742 Personal history of other diseases of the female genital tract: Secondary | ICD-10-CM | POA: Insufficient documentation

## 2013-03-24 DIAGNOSIS — S0990XA Unspecified injury of head, initial encounter: Secondary | ICD-10-CM | POA: Insufficient documentation

## 2013-03-24 DIAGNOSIS — Y9389 Activity, other specified: Secondary | ICD-10-CM | POA: Insufficient documentation

## 2013-03-24 NOTE — ED Notes (Signed)
Hit right side of head on her car yesterday while closing umbrella-denies LOC-c/o HA, blurred vision, nausea, "hard to concentrate"-pt A/O-steady gait into triage-NAD

## 2013-03-24 NOTE — ED Provider Notes (Signed)
History    CSN: [id number] Arrival date & time 03/24/13  1349  First MD Initiated Contact with Patient 03/24/13 1414     Chief Complaint  Patient presents with  . Head Injury   (Consider location/radiation/quality/duration/timing/severity/associated sxs/prior Treatment) HPI Comments: Patient reports she hit her head while getting into her car yesterday.  She denies any loc, but reports headache, blurred vision, nausea since.  She feels disoriented and is having difficulty concentrating.    Patient is a 42 y.o. female presenting with head injury. The history is provided by the patient.  Head Injury Location:  R parietal Time since incident:  24 hours Mechanism of injury: direct blow   Pain details:    Quality:  Dull   Severity:  Moderate   Timing:  Constant   Progression:  Unchanged Chronicity:  New Relieved by:  Nothing Worsened by:  Light and movement Ineffective treatments:  None tried Associated symptoms: blurred vision and disorientation   Associated symptoms: no double vision, no hearing loss, no loss of consciousness and no vomiting    Past Medical History  Diagnosis Date  . Anxiety   . Fibroid 2012    on stem of fallopian tube  . Migraine    Past Surgical History  Procedure Laterality Date  . Tubal ligation  2003  . Abdominal hysterectomy     No family history on file. History  Substance Use Topics  . Smoking status: Never Smoker   . Smokeless tobacco: Never Used  . Alcohol Use: 0.0 oz/week   OB History   Grav Para Term Preterm Abortions TAB SAB Ect Mult Living                 Review of Systems  HENT: Negative for hearing loss.   Eyes: Positive for blurred vision. Negative for double vision.  Gastrointestinal: Negative for vomiting.  Neurological: Negative for loss of consciousness.  All other systems reviewed and are negative.    Allergies  Aspirin and Latex  Home Medications   Current Outpatient Rx  Name  Route  Sig  Dispense  Refill   . ALPRAZolam (XANAX) 0.5 MG tablet   Oral   Take 0.25 mg by mouth daily as needed. For anxiety         . Eletriptan Hydrobromide (RELPAX PO)   Oral   Take 1 tablet by mouth every 4 (four) hours as needed. For migraine         . traMADol (ULTRAM) 50 MG tablet   Oral   Take 50 mg by mouth every 6 (six) hours as needed. For pain           BP 115/77  Pulse 102  Temp(Src) 98.1 F (36.7 C) (Oral)  Resp 16  Ht 5\' 2"  (1.575 m)  Wt 142 lb (64.411 kg)  BMI 25.97 kg/m2  SpO2 100% Physical Exam  Nursing note and vitals reviewed. Constitutional: She is oriented to person, place, and time. She appears well-developed and well-nourished. No distress.  HENT:  Head: Normocephalic and atraumatic.  Mouth/Throat: Oropharynx is clear and moist.  TM's clear bilaterally.  Eyes: EOM are normal. Pupils are equal, round, and reactive to light.  Neck: Normal range of motion. Neck supple.  Musculoskeletal: Normal range of motion. She exhibits no edema.  Neurological: She is alert and oriented to person, place, and time. No cranial nerve deficit. She exhibits normal muscle tone. Coordination normal.  Skin: Skin is warm and dry. She is not diaphoretic.  ED Course  Procedures (including critical care time) Labs Reviewed - No data to display No results found. No diagnosis found.  MDM  Patient presents with headache, blurry vision, and confusion after hitting her head yesterday.  The neurologic exam is non-focal and the ct of the head is negative.  Will discharge to home, to return prn for worsening of symptoms.    Geoffery Lyons, MD 03/24/13 [phone]

## 2014-05-20 IMAGING — CT CT HEAD W/O CM
1 series · 16 of 30 positions shown, 20 images · non-contrast
Comparison: None

CLINICAL DATA: Head injury

CT HEAD WITHOUT CONTRAST
TECHNIQUE: Contiguous axial images were obtained from the base of
the skull through the vertex without contrast.

[Series 2: head 4.8 h37s · axial · 0.42mm/px · z∈[+1066,+1199]mm · 16 of 32 slices shown, 20 images]
[im 2/32  brain]
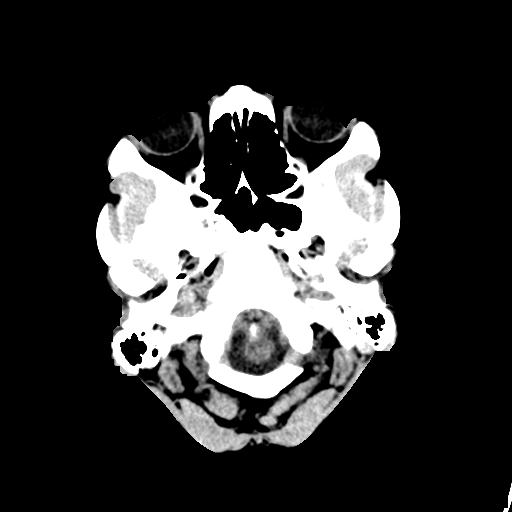
[im 2/32  bone]
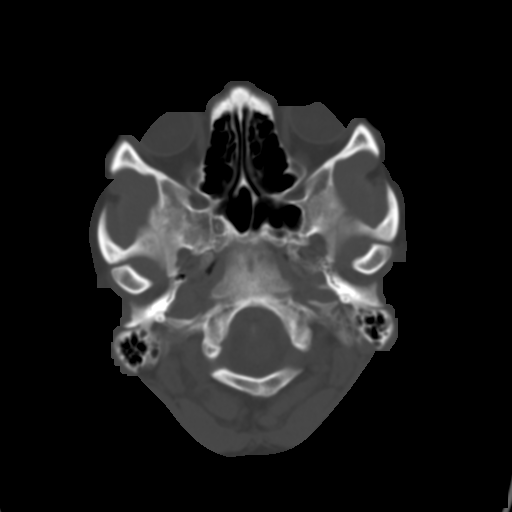
[im 4/32  brain]
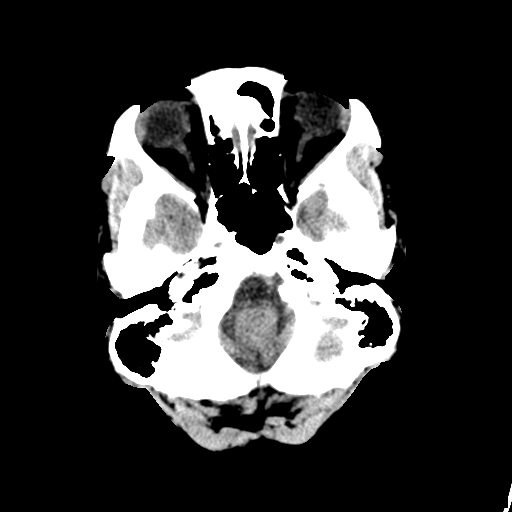
[im 6/32  brain]
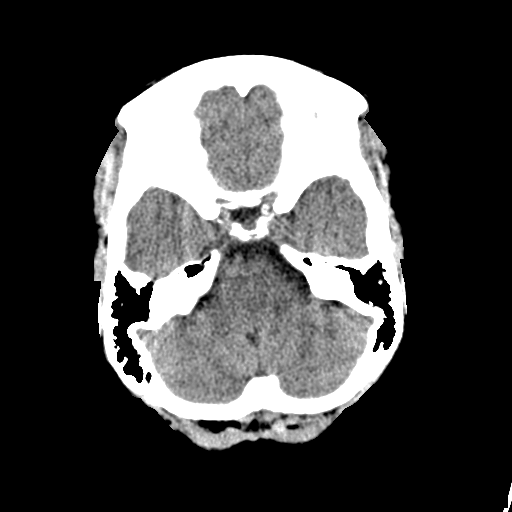
[im 8/32  brain]
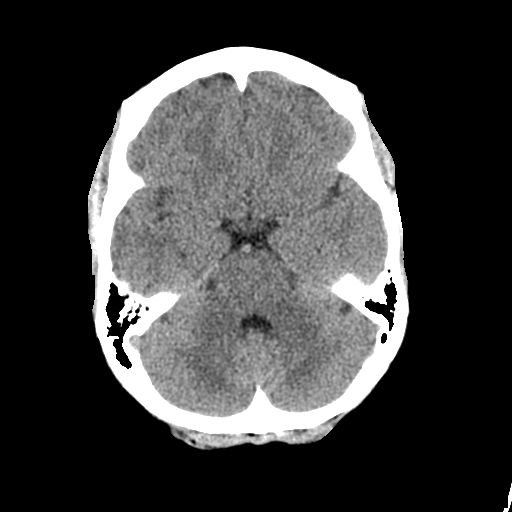
[im 9/32  brain]
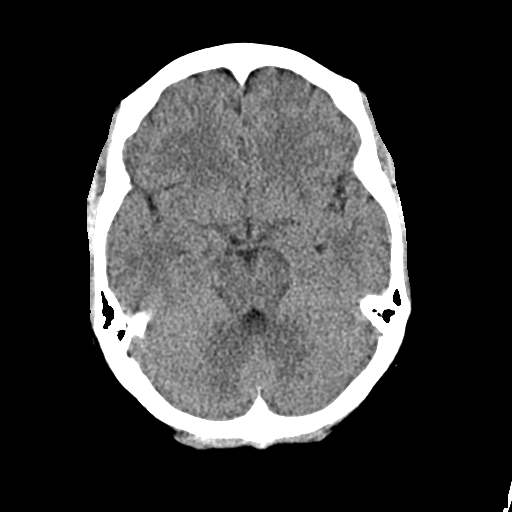
[im 9/32  bone]
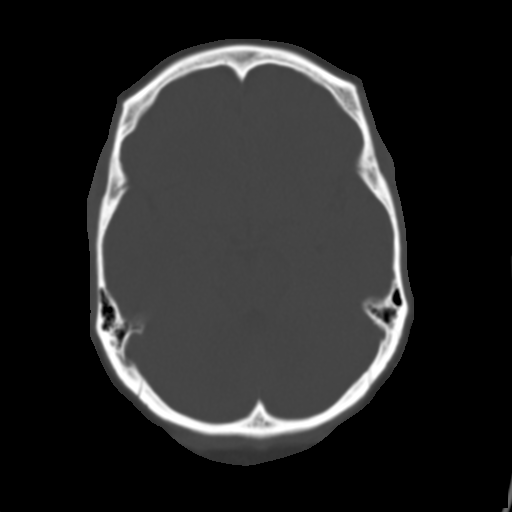
[im 11/32  brain]
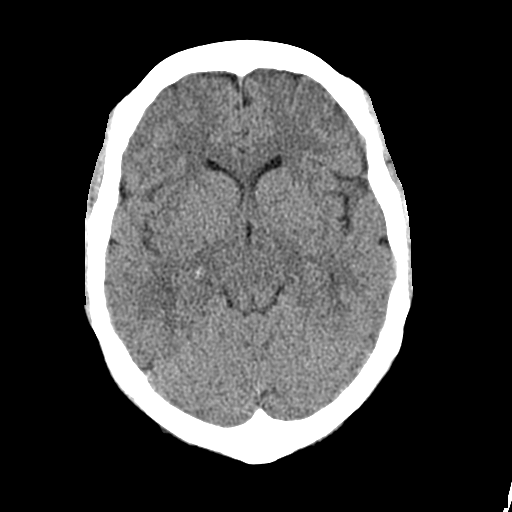
[im 13/32  brain]
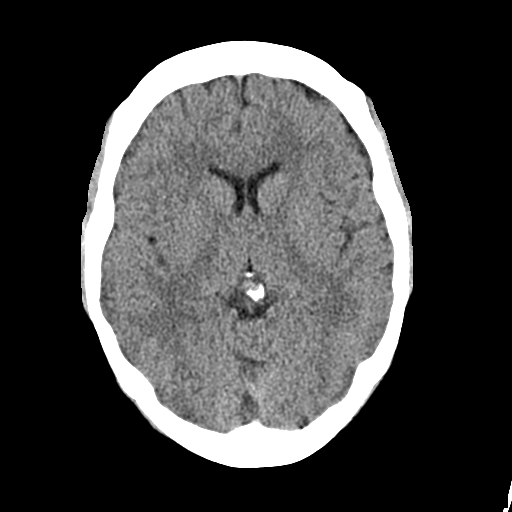
[im 15/32  brain]
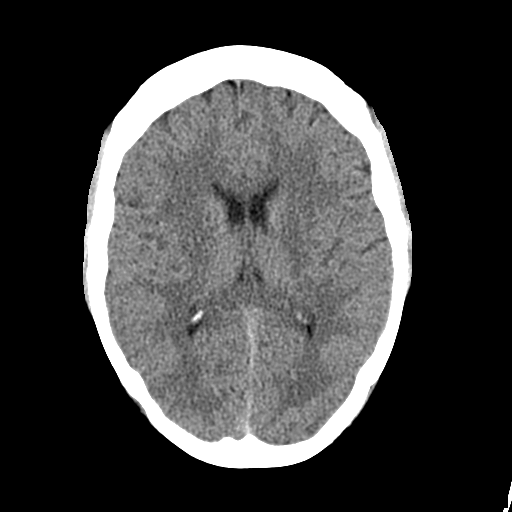
[im 17/32  brain]
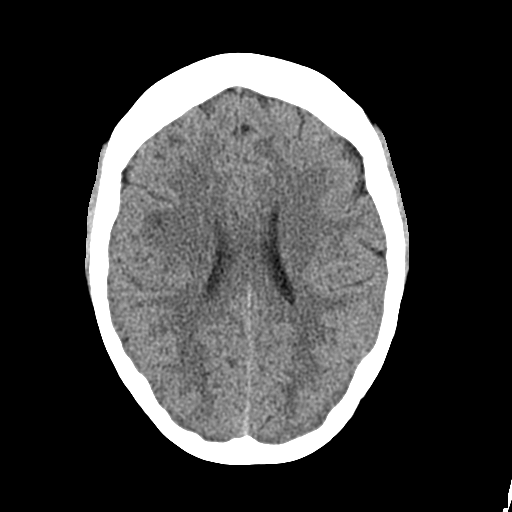
[im 17/32  bone]
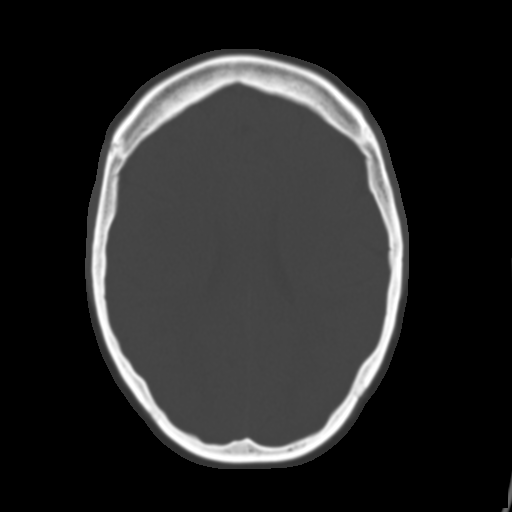
[im 19/32  brain]
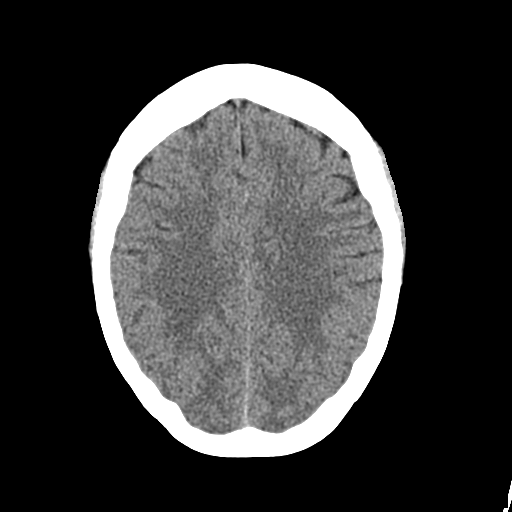
[im 21/32  brain]
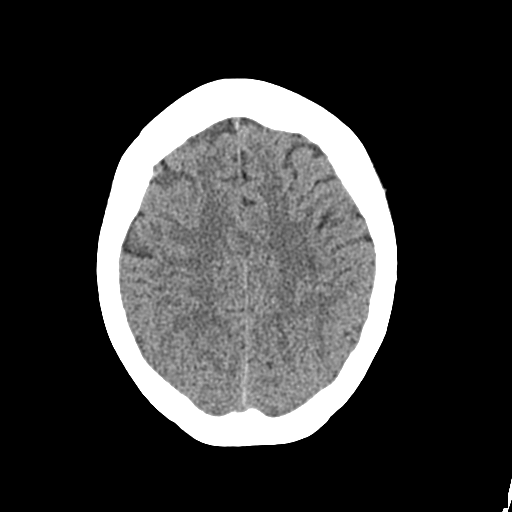
[im 23/32  brain]
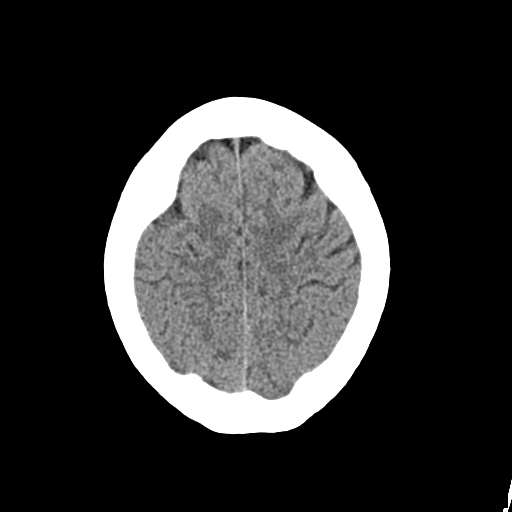
[im 24/32  brain]
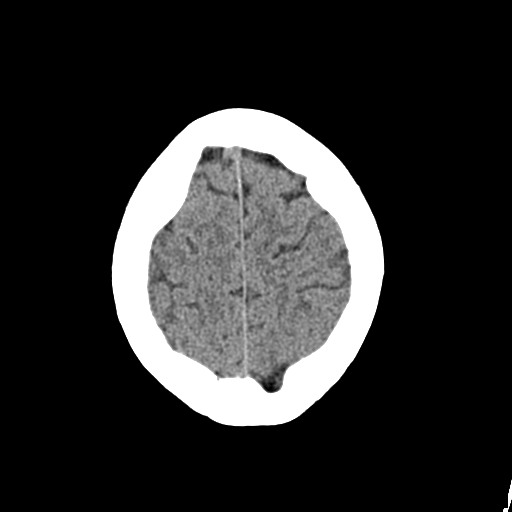
[im 24/32  bone]
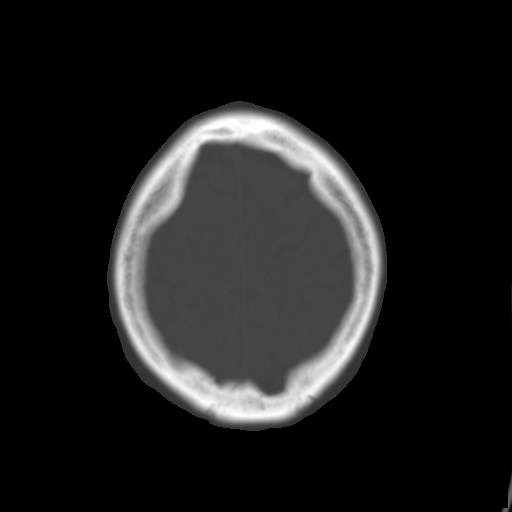
[im 26/32  brain]
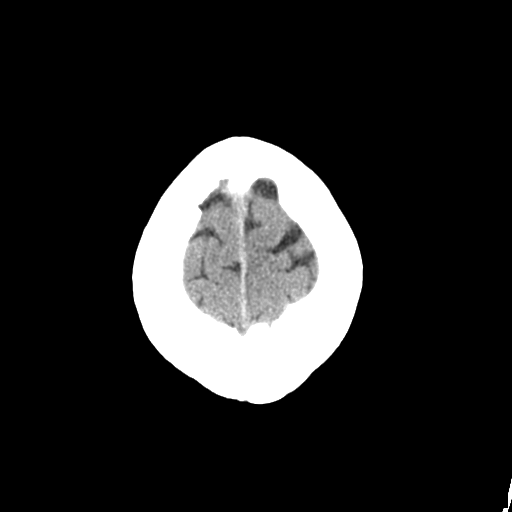
[im 28/32  brain]
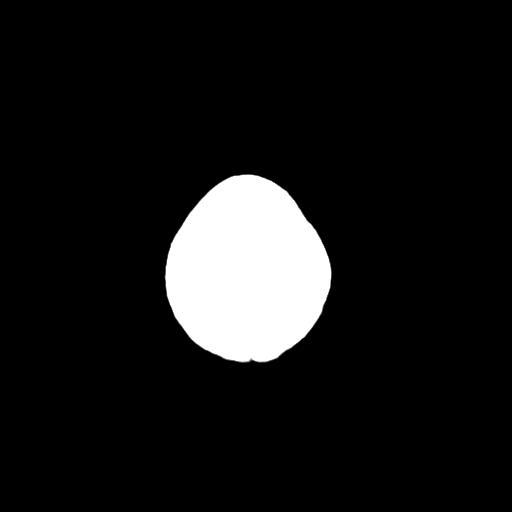
[im 30/32  brain]
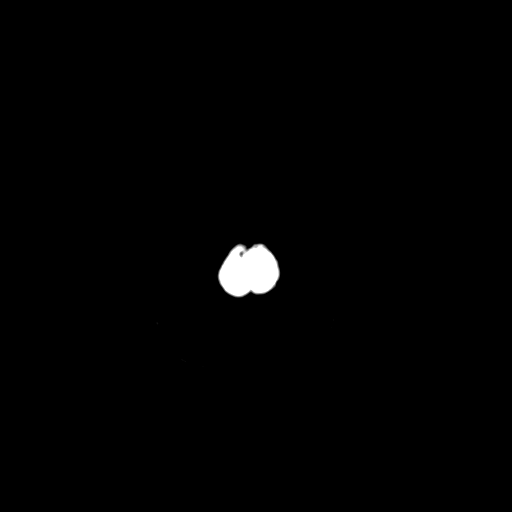

[16 of 30 positions shown; findings below may reference images not displayed]

FINDINGS: The brain has a normal appearance without evidence for
hemorrhage, infarction, hydrocephalus, or mass lesion.  There is no
extra axial fluid collection.  The skull and paranasal sinuses are
normal.
IMPRESSION: Negative exam.

## 2015-05-26 ENCOUNTER — Other Ambulatory Visit: Payer: Self-pay | Admitting: Orthopedic Surgery

## 2015-05-31 ENCOUNTER — Encounter (HOSPITAL_BASED_OUTPATIENT_CLINIC_OR_DEPARTMENT_OTHER): Payer: Self-pay | Admitting: *Deleted

## 2015-05-31 NOTE — H&P (Signed)
Sylvia Acevedo is an 44 y.o. female.   CC / Reason for Visit: Bilateral upper extremity complaints HPI: This patient returns for reevaluation following steroid injections on 04-13-15 for her left thumb and long and ring fingers.  In addition, she underwent a right carpal tunnel injection.  Previously she had bilateral carpal tunnel injections on 01-25-15.  She continues to take mobic daily and gabapentin, 100 mg 3 times a day.  She is also taking vitamin D.  She reports that on the left side, the injections helped such that she is now 70% better.  The feeling of pinching and swelling on the left side has improved.  But also she notes that he can flareup that moments.  Recently she had sugar slipped from her hand and fall to the floor in the kitchen.  She has good days mixed with bad. On the right side, she reports that it "feels like wrap".  She has a feeling of needles sticking into her across the base of her palm and into the volar and radial aspects of the thumb.  She also has prickly numbness and feeling of swelling that is the worst in the thumb, followed by the long and the ring fingers.  The index finger is reportedly not too bad. She also reports some difficulty with her recently completed FMLA paperwork and that she was hoping to be provided justification to use the intermittent absence clause for episodic flares that I had originally declined to complete when the form was first completed.   Past Medical History  Diagnosis Date  . Anxiety   . Fibroid 2012    on stem of fallopian tube  . Migraine   . Arthritis   . Hepatitis     hx hep b, no meds no problems  . Carpal tunnel syndrome     right    Past Surgical History  Procedure Laterality Date  . Tubal ligation  2003  . Abdominal hysterectomy      History reviewed. No pertinent family history. Social History:  reports that she has never smoked. She has never used smokeless tobacco. She reports that she drinks alcohol. She reports  that she does not use illicit drugs.  Allergies:  Allergies  Allergen Reactions  . Aspirin Hives  . Latex Hives    No prescriptions prior to admission    No results found for this or any previous visit (from the past 48 hour(s)). No results found.  Review of Systems  All other systems reviewed and are negative.   Height 5\' 2"  (1.575 m), weight 63.504 kg (140 lb). Physical Exam  Constitutional:  WD, WN, NAD HEENT:  NCAT, EOMI Neuro/Psych:  Alert & oriented to person, place, and time; appropriate mood & affect Lymphatic: No generalized UE edema or lymphadenopathy Extremities / MSK:  Both UE are normal with respect to appearance, ranges of motion, joint stability, muscle strength/tone, sensation, & perfusion except as otherwise noted:  Her hands do not appear visibly swollen today.  There is a Tinel sign on the right side over the median nerve in the distal forearm, and increased provocative symptoms in the right median nerve distribution with carpal compression testing and reverse Phalen's.  Ring finger splitting is actually opposite of that expected.  Labs / X-rays:  No radiographic studies obtained today.  Assessment: 1. Possible bilateral carpal tunnel syndrome, worse on the right, despite studies interpreted as normal 2. 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  We discussed the possibility of neurological evaluation.  We also discussed the possibility of proceeding with right endoscopic carpal tunnel release on the basis of her clinical evaluation  & response to steroid injection of the carpal tunnel, despite her nerve conduction studies that failed to corroborate this diagnosis.  She did have symptomatic improvement each time following carpal tunnel injection, although she reports the second injection was more favorably helpful than the  first.  At this point, that is what she would like to do it so we will plan to proceed with right endoscopic carpal tunnel release.  In addition, I will make changes to her previously completed FMLA paperwork to allow for intermittent absence for flareups, despite being uncertain as to frequency and duration of such flareups.  Hopefully, by proceeding fairly soon with her carpal tunnel release, it will be become a moot point.  THOMPSON, DAVID A. 05/31/2015, 4:37 PM

## 2015-06-05 ENCOUNTER — Ambulatory Visit (HOSPITAL_BASED_OUTPATIENT_CLINIC_OR_DEPARTMENT_OTHER)
Admission: RE | Admit: 2015-06-05 | Discharge: 2015-06-05 | Disposition: A | Discharge status: Home / Self Care | Payer: Managed Care, Other (non HMO) | Source: Ambulatory Visit | Attending: Orthopedic Surgery | Admitting: Orthopedic Surgery

## 2015-06-05 ENCOUNTER — Encounter (HOSPITAL_BASED_OUTPATIENT_CLINIC_OR_DEPARTMENT_OTHER)
Admission: RE | Disposition: A | Discharge status: Home / Self Care | Payer: Self-pay | Source: Ambulatory Visit | Attending: Orthopedic Surgery

## 2015-06-05 ENCOUNTER — Ambulatory Visit (HOSPITAL_BASED_OUTPATIENT_CLINIC_OR_DEPARTMENT_OTHER): Payer: Managed Care, Other (non HMO) | Admitting: Certified Registered"

## 2015-06-05 ENCOUNTER — Encounter (HOSPITAL_BASED_OUTPATIENT_CLINIC_OR_DEPARTMENT_OTHER): Payer: Self-pay | Admitting: Certified Registered"

## 2015-06-05 DIAGNOSIS — M65842 Other synovitis and tenosynovitis, left hand: Secondary | ICD-10-CM | POA: Insufficient documentation

## 2015-06-05 DIAGNOSIS — G5601 Carpal tunnel syndrome, right upper limb: Secondary | ICD-10-CM | POA: Insufficient documentation

## 2015-06-05 DIAGNOSIS — Z79899 Other long term (current) drug therapy: Secondary | ICD-10-CM | POA: Diagnosis not present

## 2015-06-05 DIAGNOSIS — M65841 Other synovitis and tenosynovitis, right hand: Secondary | ICD-10-CM | POA: Diagnosis not present

## 2015-06-05 HISTORY — PX: CARPAL TUNNEL RELEASE: SHX101

## 2015-06-05 HISTORY — DX: Unspecified osteoarthritis, unspecified site: M19.90

## 2015-06-05 HISTORY — DX: Inflammatory liver disease, unspecified: K75.9

## 2015-06-05 HISTORY — DX: Carpal tunnel syndrome, unspecified upper limb: G56.00

## 2015-06-05 SURGERY — RELEASE, CARPAL TUNNEL, ENDOSCOPIC
Anesthesia: Monitor Anesthesia Care | Site: Wrist | Laterality: Right

## 2015-06-05 MED ORDER — MIDAZOLAM HCL 2 MG/2ML IJ SOLN
INTRAMUSCULAR | Status: AC
  Filled 2015-06-05: qty 4

## 2015-06-05 MED ORDER — BUPIVACAINE-EPINEPHRINE (PF) 0.5% -1:200000 IJ SOLN
INTRAMUSCULAR | Status: DC | PRN
  Administered 2015-06-05: 5 mL

## 2015-06-05 MED ORDER — PROMETHAZINE HCL 25 MG/ML IJ SOLN
6.2500 mg | INTRAMUSCULAR | Status: DC | PRN
Start: 2015-06-05 — End: 2015-06-05

## 2015-06-05 MED ORDER — PROPOFOL 10 MG/ML IV BOLUS
INTRAVENOUS | Status: DC | PRN
  Administered 2015-06-05: 10 mg via INTRAVENOUS
  Administered 2015-06-05: 30 mg via INTRAVENOUS
  Administered 2015-06-05: 20 mg via INTRAVENOUS

## 2015-06-05 MED ORDER — SCOPOLAMINE 1 MG/3DAYS TD PT72: 1.0000 | MEDICATED_PATCH | Freq: Once | TRANSDERMAL | Status: DC | PRN

## 2015-06-05 MED ORDER — ONDANSETRON HCL 4 MG/2ML IJ SOLN
INTRAMUSCULAR | Status: DC | PRN
  Administered 2015-06-05: 4 mg via INTRAVENOUS

## 2015-06-05 MED ORDER — BUPIVACAINE-EPINEPHRINE (PF) 0.5% -1:200000 IJ SOLN
INTRAMUSCULAR | Status: AC
  Filled 2015-06-05: qty 30

## 2015-06-05 MED ORDER — LIDOCAINE HCL (CARDIAC) 20 MG/ML IV SOLN
INTRAVENOUS | Status: AC
  Filled 2015-06-05: qty 5

## 2015-06-05 MED ORDER — LACTATED RINGERS IV SOLN
INTRAVENOUS | Status: DC
  Administered 2015-06-05: 08:00:00 via INTRAVENOUS

## 2015-06-05 MED ORDER — LIDOCAINE HCL (CARDIAC) 20 MG/ML IV SOLN
INTRAVENOUS | Status: DC | PRN
  Administered 2015-06-05: 30 mg via INTRAVENOUS

## 2015-06-05 MED ORDER — LIDOCAINE HCL 2 % IJ SOLN
INTRAMUSCULAR | Status: DC | PRN
  Administered 2015-06-05: 5 mL

## 2015-06-05 MED ORDER — CEFAZOLIN SODIUM-DEXTROSE 2-3 GM-% IV SOLR
2.0000 g | INTRAVENOUS | Status: AC
  Administered 2015-06-05: 2 g via INTRAVENOUS

## 2015-06-05 MED ORDER — GLYCOPYRROLATE 0.2 MG/ML IJ SOLN: 0.2000 mg | Freq: Once | INTRAMUSCULAR | Status: DC | PRN

## 2015-06-05 MED ORDER — FENTANYL CITRATE (PF) 100 MCG/2ML IJ SOLN
INTRAMUSCULAR | Status: AC
  Filled 2015-06-05: qty 4

## 2015-06-05 MED ORDER — FENTANYL CITRATE (PF) 100 MCG/2ML IJ SOLN
50.0000 ug | INTRAMUSCULAR | Status: DC | PRN
  Administered 2015-06-05: 50 ug via INTRAVENOUS

## 2015-06-05 MED ORDER — FENTANYL CITRATE (PF) 100 MCG/2ML IJ SOLN: 25.0000 ug | INTRAMUSCULAR | Status: DC | PRN

## 2015-06-05 MED ORDER — ONDANSETRON HCL 4 MG/2ML IJ SOLN
INTRAMUSCULAR | Status: AC
  Filled 2015-06-05: qty 2

## 2015-06-05 MED ORDER — LACTATED RINGERS IV SOLN: INTRAVENOUS | Status: DC

## 2015-06-05 MED ORDER — MIDAZOLAM HCL 2 MG/2ML IJ SOLN
1.0000 mg | INTRAMUSCULAR | Status: DC | PRN
  Administered 2015-06-05: 2 mg via INTRAVENOUS

## 2015-06-05 MED ORDER — HYDROCODONE-ACETAMINOPHEN 5-325 MG PO TABS: 1.0000 | ORAL_TABLET | Freq: Four times a day (QID) | ORAL | Status: DC | PRN

## 2015-06-05 MED ORDER — CEFAZOLIN SODIUM-DEXTROSE 2-3 GM-% IV SOLR
INTRAVENOUS | Status: AC
  Filled 2015-06-05: qty 50

## 2015-06-05 MED ORDER — LIDOCAINE HCL 2 % IJ SOLN
INTRAMUSCULAR | Status: AC
  Filled 2015-06-05: qty 20

## 2015-06-05 SURGICAL SUPPLY — 40 items
APPLICATOR COTTON TIP 6IN STRL (MISCELLANEOUS) ×2 IMPLANT
BLADE HOOK ENDO STRL (BLADE) ×2 IMPLANT
BLADE SURG 15 STRL LF DISP TIS (BLADE) ×1 IMPLANT
BLADE SURG 15 STRL SS (BLADE) ×1
BLADE TRIANGLE EPF/EGR ENDO (BLADE) ×2 IMPLANT
BNDG COHESIVE 4X5 TAN STRL (GAUZE/BANDAGES/DRESSINGS) ×2 IMPLANT
BNDG ESMARK 4X9 LF (GAUZE/BANDAGES/DRESSINGS) ×2 IMPLANT
BNDG GAUZE ELAST 4 BULKY (GAUZE/BANDAGES/DRESSINGS) ×2 IMPLANT
CHLORAPREP W/TINT 26ML (MISCELLANEOUS) ×2 IMPLANT
CORDS BIPOLAR (ELECTRODE) IMPLANT
COVER BACK TABLE 60X90IN (DRAPES) ×2 IMPLANT
COVER MAYO STAND STRL (DRAPES) ×2 IMPLANT
CUFF TOURNIQUET SINGLE 18IN (TOURNIQUET CUFF) IMPLANT
DRAIN TLS ROUND 10FR (DRAIN) ×2 IMPLANT
DRAPE EXTREMITY T 121X128X90 (DRAPE) ×2 IMPLANT
DRAPE SURG 17X23 STRL (DRAPES) ×2 IMPLANT
DRSG EMULSION OIL 3X3 NADH (GAUZE/BANDAGES/DRESSINGS) ×2 IMPLANT
GLOVE BIO SURGEON STRL SZ7.5 (GLOVE) ×2 IMPLANT
GLOVE BIOGEL PI IND STRL 7.0 (GLOVE) ×1 IMPLANT
GLOVE BIOGEL PI IND STRL 8 (GLOVE) ×1 IMPLANT
GLOVE BIOGEL PI INDICATOR 7.0 (GLOVE) ×1
GLOVE BIOGEL PI INDICATOR 8 (GLOVE) ×1
GLOVE ECLIPSE 6.5 STRL STRAW (GLOVE) ×2 IMPLANT
GOWN STRL REUS W/ TWL LRG LVL3 (GOWN DISPOSABLE) ×2 IMPLANT
GOWN STRL REUS W/TWL LRG LVL3 (GOWN DISPOSABLE) ×2
GOWN STRL REUS W/TWL XL LVL3 (GOWN DISPOSABLE) ×2 IMPLANT
NEEDLE HYPO 25X1 1.5 SAFETY (NEEDLE) IMPLANT
NS IRRIG 1000ML POUR BTL (IV SOLUTION) ×2 IMPLANT
PACK BASIN DAY SURGERY FS (CUSTOM PROCEDURE TRAY) ×2 IMPLANT
PADDING CAST ABS 4INX4YD NS (CAST SUPPLIES)
PADDING CAST ABS COTTON 4X4 ST (CAST SUPPLIES) IMPLANT
SPONGE GAUZE 4X4 12PLY STER LF (GAUZE/BANDAGES/DRESSINGS) ×2 IMPLANT
STOCKINETTE 6  STRL (DRAPES) ×1
STOCKINETTE 6 STRL (DRAPES) ×1 IMPLANT
SUT VICRYL RAPIDE 4-0 (SUTURE) ×2 IMPLANT
SYR BULB 3OZ (MISCELLANEOUS) ×2 IMPLANT
SYRINGE 10CC LL (SYRINGE) IMPLANT
TOWEL OR 17X24 6PK STRL BLUE (TOWEL DISPOSABLE) ×4 IMPLANT
TOWEL OR NON WOVEN STRL DISP B (DISPOSABLE) ×2 IMPLANT
UNDERPAD 30X30 (UNDERPADS AND DIAPERS) ×2 IMPLANT

## 2015-06-05 NOTE — Op Note (Signed)
06/05/2015  9:09 AM  PATIENT:  Sylvia Acevedo  44 y.o. female  PRE-OPERATIVE DIAGNOSIS:  RIGHT CARPAL TUNNEL SYNDROME  POST-OPERATIVE DIAGNOSIS:  Same  PROCEDURE:  Procedure(s): RIGHT ENDOSCOPIC CARPAL TUNNEL RELEASE   SURGEON:  Surgeon(s): Milly Jakob, MD  PHYSICIAN ASSISTANT: Morley Kos, OPA-C  ANESTHESIA:  Local / MAC  SPECIMENS:  None  DRAINS:   TLS x 1  EBL:  less than 50 mL  PREOPERATIVE INDICATIONS:  [name] is a  44 y.o. female with a diagnosis of RIGHT CARPAL TUNNEL SYNDROME who failed conservative measures and elected for surgical management.    The risks benefits and alternatives were discussed with the patient preoperatively including but not limited to the risks of infection, bleeding, nerve injury, cardiopulmonary complications, the need for revision surgery, among others, and the patient verbalized understanding and consented to proceed.  OPERATIVE IMPLANTS: None  OPERATIVE FINDINGS: Tight carpal tunnel, acceptably decompressed following release  OPERATIVE PROCEDURE:  After receiving prophylactic antibiotics, the patient was escorted to the operative theatre and placed in a supine position.   A surgical "time-out" was performed during which the planned procedure, proposed operative site, and the correct patient identity were compared to the operative consent and agreement confirmed by the circulating nurse according to current facility policy.  Following application of a tourniquet to the operative extremity, the planned incisions were marked and anesthetized with a mixture of lidocaine & bupivicaine containing epinephrine.  The exposed skin was prepped with Chloraprep and draped in the usual sterile fashion.  The limb was exsanguinated with an Esmarch bandage and the tourniquet inflated to approximately 167mmHg higher than systolic BP.   The incisions were made sharply. Subcutaneous tissues were dissected with blunt and spreading dissection. At the  proximal incision, the deep forearm fascia was split in line with the skin incision the distal edge grasped with a hemostat. At the mid palmar incision, the palmar fascia was split in line with the skin incision, revealing the underlying superficial palmar arch which was visualized with loupe assisted magnification. The synovial reflector was then introduced into the proximal incision and passed through the carpal canal, uses to reflect synovium from the deep surface of the transverse carpal ligament. It was removed and replaced with the slotted cannula and blunt obturator, passing from proximal to distal and exiting the distal wound superficial to the superficial palmar arch. The obturator was removed and the camera inserted. Visualization of the ligament was acceptable. The triangle shape blade was then inserted distally, advanced to the midportion of the ligament, and there used to create a perforation in the ligament. This instrument was removed and the hooked nstrument was inserted. It was placed into the perforation in the ligament and withdrawn distally, completing transection of the distal half of the ligament. The camera was then removed and placed into the distal end of the cannula. The hooked instrument was placed into the proximal end and advanced facility to be placed into the apex of the V. which had been formed to the distal hemi-transection of the ligament. It was withdrawn proximally, completing transection of the ligament. The adequacy of the release was judged with the scope and the instruments used as a probe. All of the endoscopic instruments are removed and the adequacy of release was again judged from the proximal incision perspective with direct loupe assisted visualization. In addition, the proximal forearm fascia was split for 2 inches proximal to the proximal incision under direct visualization using a sliding scissor technique.  The tourniquet was released, the wound copiously irrigated. A  TLS drain was placed to lie alongside the nerve exiting its own skin hole distally made with some sharp trocar. The skin was then closed with 4-0 Vicryl Rapide interrupted sutures. A light dressing was applied and the balance of 10 mL of the initial anesthetic mixture was placed down the drain, and the drain was clamped to be unclamped in the recovery room.  DISPOSITION:  The patient will be discharged home today, returning in 10-15 days for re-assessment.

## 2015-06-05 NOTE — Anesthesia Preprocedure Evaluation (Addendum)
Anesthesia Evaluation  Patient identified by MRN, date of birth, ID band Patient awake    Reviewed: Allergy & Precautions, NPO status , Patient's Chart, lab work & pertinent test results  Airway Mallampati: II  TM Distance: >3 FB Neck ROM: Full    Dental   Pulmonary neg pulmonary ROS,    breath sounds clear to auscultation       Cardiovascular negative cardio ROS   Rhythm:Regular Rate:Normal     Neuro/Psych  Headaches, PSYCHIATRIC DISORDERS Anxiety    GI/Hepatic negative GI ROS, (+) Hepatitis -  Endo/Other  negative endocrine ROS  Renal/GU negative Renal ROS     Musculoskeletal  (+) Arthritis ,   Abdominal   Peds  Hematology negative hematology ROS (+)   Anesthesia Other Findings   Reproductive/Obstetrics                            Anesthesia Physical Anesthesia Plan  ASA: II  Anesthesia Plan: MAC   Post-op Pain Management:    Induction: Intravenous  Airway Management Planned: Natural Airway and Simple Face Mask  Additional Equipment:   Intra-op Plan:   Post-operative Plan:   Informed Consent: I have reviewed the patients History and Physical, chart, labs and discussed the procedure including the risks, benefits and alternatives for the proposed anesthesia with the patient or authorized representative who has indicated his/her understanding and acceptance.     Plan Discussed with: CRNA  Anesthesia Plan Comments:         Anesthesia Quick Evaluation

## 2015-06-05 NOTE — Discharge Instructions (Addendum)
Discharge Instructions   You have a light dressing on your hand.  You may begin gentle motion of your fingers and hand immediately, but you should not do any heavy lifting or gripping.  Elevate your hand to reduce pain & swelling of the digits.  Ice over the operative site may be helpful to reduce pain & swelling.  DO NOT USE HEAT. Pain medicine has been prescribed for you.  Use your medicine as needed over the first 48 hours, and then you can begin to taper your use. You may use Tylenol in place of your prescribed pain medication, but not IN ADDITION to it. Leave the dressing in place until the third day after your surgery and then remove it, leaving it open to air.  After the bandage has been removed you may shower, but do not soak the incision.  You may drive a car when you are off of prescription pain medications and can safely control your vehicle with both hands. We will address whether therapy will be required or not when you return to the office. You may have already made your follow-up appointment when we completed your preop visit.  If not, please call our office today or the next business day to make your return appointment for 10-15 days after surgery.   Please call [phone] during normal business hours or [phone] after hours for any problems. Including the following:  - excessive redness of the incisions - drainage for more than 4 days - fever of more than 101.5 F  *Please note that pain medications will not be refilled after hours or on weekends.   TLS Drain Instructions You have a drain tube in place to help limit the amount of blood that collects under your skin in the early post-operative period.  The amount it drains will vary from person-to-person and is dependent upon many factors.  You will have 1-2 extra drain tubes sent with you from the facility.  You should change the tube according to these instructions below when the tube is about half full.  BE SURE TO  SLIDE THE CLAMP TO THE CLOSED POSITION BEFORE CHANGING THE TUBE.    RE-OPEN THE CLAMP ONCE THE GLASS EVACUATION TUBE HAS BEEN CHANGED.  24 hours after surgery the amount of drainage will likely be negligible and it will be time to remove the tube and discard it.  Simply remove the tape that secures the flexible plastic drainage tube to the bandage and briskly pull the tube straight out.  You will likely feel a little discomfort during this process, but the tube should slide out as it is not sewn or otherwise secured directly to your body.  It is merely held in place by the bandage itself.                              If you are not clear about when your first post-op appointment is scheduled, please call the office on the next business day to inquire about it.  Please also call the office if you have any other questions [phone]).    WORK STATUS:  NO WORK BETWEEN DAY OF SURGERY AND FIRST POST-OP VISIT IN 10-15 DAYS.  EXPECT TO RETURN TO WORK WITH RESTRICTIONS RELATED TO PRODUCTIVITY THE FOLLOWING DAY.   Post Anesthesia Home Care Instructions  Activity: Get plenty of rest for the remainder of the day. A responsible adult should stay with you for 24 hours  following the procedure.  For the next 24 hours, DO NOT: -Drive a car -Paediatric nurse -Drink alcoholic beverages -Take any medication unless instructed by your physician -Make any legal decisions or sign important papers.  Meals: Start with liquid foods such as gelatin or soup. Progress to regular foods as tolerated. Avoid greasy, spicy, heavy foods. If nausea and/or vomiting occur, drink only clear liquids until the nausea and/or vomiting subsides. Call your physician if vomiting continues.  Special Instructions/Symptoms: Your throat may feel dry or sore from the anesthesia or the breathing tube placed in your throat during surgery. If this causes discomfort, gargle with warm salt water. The discomfort should disappear within 24  hours.  If you had a scopolamine patch placed behind your ear for the management of post- operative nausea and/or vomiting:  1. The medication in the patch is effective for 72 hours, after which it should be removed.  Wrap patch in a tissue and discard in the trash. Wash hands thoroughly with soap and water. 2. You may remove the patch earlier than 72 hours if you experience unpleasant side effects which may include dry mouth, dizziness or visual disturbances. 3. Avoid touching the patch. Wash your hands with soap and water after contact with the patch.

## 2015-06-05 NOTE — Anesthesia Procedure Notes (Signed)
Procedure Name: MAC Date/Time: 06/05/2015 9:10 AM Performed by: BLOCKER, TIMOTHY D Pre-anesthesia Checklist: Patient identified, Emergency Drugs available, Suction available, Patient being monitored and Timeout performed Patient Re-evaluated:Patient Re-evaluated prior to inductionOxygen Delivery Method: Simple face mask

## 2015-06-05 NOTE — Anesthesia Postprocedure Evaluation (Signed)
  Anesthesia Post-op Note  Patient: Sylvia Acevedo  Procedure(s) Performed: Procedure(s): RIGHT ENDOSCOPIC CARPAL TUNNEL RELEASE  (Right)  Patient Location: PACU  Anesthesia Type:MAC  Level of Consciousness: awake and alert   Airway and Oxygen Therapy: Patient Spontanous Breathing  Post-op Pain: none  Post-op Assessment: Post-op Vital signs reviewed              Post-op Vital Signs: Reviewed  Last Vitals:  Filed Vitals:   06/05/15 1100  BP: 105/80  Pulse: 71  Temp: 36.6 C  Resp: 16    Complications: No apparent anesthesia complications

## 2015-06-05 NOTE — Interval H&P Note (Signed)
History and Physical Interval Note:  06/05/2015 9:08 AM  Sylvia Acevedo  has presented today for surgery, with the diagnosis of RIGHT CARPAL TUNNEL SYNDROME  The various methods of treatment have been discussed with the patient and family. After consideration of risks, benefits and other options for treatment, the patient has consented to  Procedure(s): RIGHT ENDOSCOPIC CARPAL TUNNEL RELEASE  (Right) as a surgical intervention .  The patient's history has been reviewed, patient examined, no change in status, stable for surgery.  I have reviewed the patient's chart and labs.  Questions were answered to the patient's satisfaction.     THOMPSON, DAVID A.

## 2015-06-05 NOTE — Transfer of Care (Signed)
Immediate Anesthesia Transfer of Care Note  Patient: Sylvia Acevedo  Procedure(s) Performed: Procedure(s): RIGHT ENDOSCOPIC CARPAL TUNNEL RELEASE  (Right)  Patient Location: PACU  Anesthesia Type:MAC  Level of Consciousness: awake, alert , oriented and patient cooperative  Airway & Oxygen Therapy: Patient Spontanous Breathing and Patient connected to face mask oxygen  Post-op Assessment: Report given to RN and Post -op Vital signs reviewed and stable  Post vital signs: Reviewed and stable  Last Vitals:  Filed Vitals:   06/05/15 0730  BP: 108/76  Pulse: 83  Temp: 36.7 C  Resp: 16    Complications: No apparent anesthesia complications

## 2015-06-06 ENCOUNTER — Encounter (HOSPITAL_BASED_OUTPATIENT_CLINIC_OR_DEPARTMENT_OTHER): Payer: Self-pay | Admitting: Orthopedic Surgery

## 2015-06-20 ENCOUNTER — Other Ambulatory Visit: Payer: Self-pay | Admitting: Orthopedic Surgery

## 2015-06-20 ENCOUNTER — Encounter (HOSPITAL_BASED_OUTPATIENT_CLINIC_OR_DEPARTMENT_OTHER): Payer: Self-pay | Admitting: *Deleted

## 2015-06-22 NOTE — H&P (Signed)
Sylvia Acevedo is an 44 y.o. female.   Chief Complaint: L hand N/T HPI: This patient has signs and symptoms of  Carpal tunnel syndrome on the left side, having successfully achieved alleviation of symptoms on the right side following  Right endoscopic carpal tunnel release.  She wishes to proceed on the left side is  Past Medical History  Diagnosis Date  . Anxiety   . Fibroid 2012    on stem of fallopian tube  . Migraine   . Arthritis   . Carpal tunnel syndrome     right  . Hepatitis     hx hep b, no meds no problems    Past Surgical History  Procedure Laterality Date  . Tubal ligation  2003  . Abdominal hysterectomy    . Carpal tunnel release Right 06/05/2015    Procedure: RIGHT ENDOSCOPIC CARPAL TUNNEL RELEASE ;  Surgeon: Milly Jakob, MD;  Location: [facility];  Service: Orthopedics;  Laterality: Right;    History reviewed. No pertinent family history. Social History:  reports that she has never smoked. She has never used smokeless tobacco. She reports that she drinks alcohol. She reports that she does not use illicit drugs.  Allergies:  Allergies  Allergen Reactions  . Aspirin Hives  . Latex Hives    No prescriptions prior to admission    No results found for this or any previous visit (from the past 48 hour(s)). No results found.  Review of Systems  All other systems reviewed and are negative.   Height 5\' 2"  (1.575 m), weight 66.225 kg (146 lb). Physical Exam  Constitutional:  WD, WN, NAD HEENT:  NCAT, EOMI Neuro/Psych:  Alert & oriented to person, place, and time; appropriate mood & affect Lymphatic: No generalized UE edema or lymphadenopathy Extremities / MSK:  Both UE are normal with respect to appearance, ranges of motion, joint stability, muscle strength/tone, sensation, & perfusion except as otherwise noted:  On the right side, her incisions are healing nicely.  On the left side she has altered light touch sensibility in the median  distribution with ring finger splitting.  Tinel's and Phalen's areequivocal.  Labs / X-rays:  No radiographic studies obtained today.  Assessment: Possible left carpal tunnel syndrome  Plan:  We will plan to proceed with left endoscopic carpal tunnel release similarly to what we just performed on the right side.    The details of the operative procedure were discussed with the patient.  Questions were invited and answered.  In addition to the goal of the procedure, the risks of the procedure to include but not limited to bleeding; infection; damage to the nerves or blood vessels that could result in bleeding, numbness, weakness, chronic pain, and the need for additional procedures; stiffness; the need for revision surgery; and anesthetic risks, were reviewed.  No specific outcome was guaranteed or implied.  Informed consent was obtained.   Ilamae Geng A. 06/22/2015, 9:56 AM

## 2015-06-27 ENCOUNTER — Ambulatory Visit (HOSPITAL_BASED_OUTPATIENT_CLINIC_OR_DEPARTMENT_OTHER): Payer: Managed Care, Other (non HMO) | Admitting: Anesthesiology

## 2015-06-27 ENCOUNTER — Encounter (HOSPITAL_BASED_OUTPATIENT_CLINIC_OR_DEPARTMENT_OTHER): Payer: Self-pay | Admitting: Anesthesiology

## 2015-06-27 ENCOUNTER — Ambulatory Visit (HOSPITAL_BASED_OUTPATIENT_CLINIC_OR_DEPARTMENT_OTHER)
Admission: RE | Admit: 2015-06-27 | Discharge: 2015-06-27 | Disposition: A | Discharge status: Home / Self Care | Payer: Managed Care, Other (non HMO) | Source: Ambulatory Visit | Attending: Orthopedic Surgery | Admitting: Orthopedic Surgery

## 2015-06-27 ENCOUNTER — Encounter (HOSPITAL_BASED_OUTPATIENT_CLINIC_OR_DEPARTMENT_OTHER)
Admission: RE | Disposition: A | Discharge status: Home / Self Care | Payer: Self-pay | Source: Ambulatory Visit | Attending: Orthopedic Surgery

## 2015-06-27 DIAGNOSIS — G5602 Carpal tunnel syndrome, left upper limb: Secondary | ICD-10-CM | POA: Diagnosis not present

## 2015-06-27 DIAGNOSIS — F419 Anxiety disorder, unspecified: Secondary | ICD-10-CM | POA: Diagnosis not present

## 2015-06-27 DIAGNOSIS — M1990 Unspecified osteoarthritis, unspecified site: Secondary | ICD-10-CM | POA: Diagnosis not present

## 2015-06-27 DIAGNOSIS — Z791 Long term (current) use of non-steroidal anti-inflammatories (NSAID): Secondary | ICD-10-CM | POA: Insufficient documentation

## 2015-06-27 HISTORY — PX: CARPAL TUNNEL RELEASE: SHX101

## 2015-06-27 SURGERY — RELEASE, CARPAL TUNNEL, ENDOSCOPIC
Anesthesia: Monitor Anesthesia Care | Site: Hand | Laterality: Left

## 2015-06-27 MED ORDER — PROPOFOL 10 MG/ML IV BOLUS
INTRAVENOUS | Status: AC
  Filled 2015-06-27: qty 20

## 2015-06-27 MED ORDER — FENTANYL CITRATE (PF) 100 MCG/2ML IJ SOLN
INTRAMUSCULAR | Status: DC | PRN
  Administered 2015-06-27: 50 ug via INTRAVENOUS

## 2015-06-27 MED ORDER — LACTATED RINGERS IV SOLN: INTRAVENOUS | Status: DC

## 2015-06-27 MED ORDER — MIDAZOLAM HCL 2 MG/2ML IJ SOLN
INTRAMUSCULAR | Status: AC
  Filled 2015-06-27: qty 4

## 2015-06-27 MED ORDER — CEFAZOLIN SODIUM-DEXTROSE 2-3 GM-% IV SOLR
2.0000 g | INTRAVENOUS | Status: AC
  Administered 2015-06-27: 2 g via INTRAVENOUS

## 2015-06-27 MED ORDER — ONDANSETRON HCL 4 MG/2ML IJ SOLN
INTRAMUSCULAR | Status: DC | PRN
  Administered 2015-06-27: 4 mg via INTRAVENOUS

## 2015-06-27 MED ORDER — LIDOCAINE HCL 2 % IJ SOLN
INTRAMUSCULAR | Status: AC
  Filled 2015-06-27: qty 20

## 2015-06-27 MED ORDER — MEPERIDINE HCL 25 MG/ML IJ SOLN: 6.2500 mg | INTRAMUSCULAR | Status: DC | PRN

## 2015-06-27 MED ORDER — HYDROCODONE-ACETAMINOPHEN 5-325 MG PO TABS: 1.0000 | ORAL_TABLET | Freq: Four times a day (QID) | ORAL | Status: AC | PRN

## 2015-06-27 MED ORDER — HYDROMORPHONE HCL 1 MG/ML IJ SOLN
0.2500 mg | INTRAMUSCULAR | Status: DC | PRN
  Administered 2015-06-27 (×2): 0.5 mg via INTRAVENOUS

## 2015-06-27 MED ORDER — ATROPINE SULFATE 0.4 MG/ML IJ SOLN
INTRAMUSCULAR | Status: AC
  Filled 2015-06-27: qty 1

## 2015-06-27 MED ORDER — EPHEDRINE SULFATE 50 MG/ML IJ SOLN
INTRAMUSCULAR | Status: AC
  Filled 2015-06-27: qty 1

## 2015-06-27 MED ORDER — PROMETHAZINE HCL 25 MG/ML IJ SOLN: 6.2500 mg | INTRAMUSCULAR | Status: DC | PRN

## 2015-06-27 MED ORDER — FENTANYL CITRATE (PF) 100 MCG/2ML IJ SOLN: 50.0000 ug | INTRAMUSCULAR | Status: DC | PRN

## 2015-06-27 MED ORDER — ONDANSETRON HCL 4 MG/2ML IJ SOLN
INTRAMUSCULAR | Status: AC
  Filled 2015-06-27: qty 2

## 2015-06-27 MED ORDER — MIDAZOLAM HCL 5 MG/5ML IJ SOLN
INTRAMUSCULAR | Status: DC | PRN
  Administered 2015-06-27: 2 mg via INTRAVENOUS

## 2015-06-27 MED ORDER — BUPIVACAINE-EPINEPHRINE (PF) 0.5% -1:200000 IJ SOLN
INTRAMUSCULAR | Status: DC | PRN
  Administered 2015-06-27: 10 mL

## 2015-06-27 MED ORDER — PHENYLEPHRINE HCL 10 MG/ML IJ SOLN
INTRAMUSCULAR | Status: AC
  Filled 2015-06-27: qty 1

## 2015-06-27 MED ORDER — LIDOCAINE HCL (CARDIAC) 20 MG/ML IV SOLN
INTRAVENOUS | Status: AC
  Filled 2015-06-27: qty 5

## 2015-06-27 MED ORDER — GLYCOPYRROLATE 0.2 MG/ML IJ SOLN: 0.2000 mg | Freq: Once | INTRAMUSCULAR | Status: DC | PRN

## 2015-06-27 MED ORDER — FENTANYL CITRATE (PF) 100 MCG/2ML IJ SOLN
INTRAMUSCULAR | Status: AC
  Filled 2015-06-27: qty 4

## 2015-06-27 MED ORDER — LACTATED RINGERS IV SOLN
INTRAVENOUS | Status: DC
  Administered 2015-06-27: 13:00:00 via INTRAVENOUS

## 2015-06-27 MED ORDER — OXYCODONE HCL 5 MG PO TABS
ORAL_TABLET | ORAL | Status: AC
  Filled 2015-06-27: qty 1

## 2015-06-27 MED ORDER — OXYCODONE HCL 5 MG/5ML PO SOLN: 5.0000 mg | Freq: Once | ORAL | Status: AC | PRN

## 2015-06-27 MED ORDER — SUCCINYLCHOLINE CHLORIDE 20 MG/ML IJ SOLN
INTRAMUSCULAR | Status: AC
  Filled 2015-06-27: qty 1

## 2015-06-27 MED ORDER — BUPIVACAINE-EPINEPHRINE (PF) 0.5% -1:200000 IJ SOLN
INTRAMUSCULAR | Status: AC
  Filled 2015-06-27: qty 30

## 2015-06-27 MED ORDER — SCOPOLAMINE 1 MG/3DAYS TD PT72: 1.0000 | MEDICATED_PATCH | Freq: Once | TRANSDERMAL | Status: DC | PRN

## 2015-06-27 MED ORDER — OXYCODONE HCL 5 MG PO TABS
5.0000 mg | ORAL_TABLET | Freq: Once | ORAL | Status: AC | PRN
  Administered 2015-06-27: 5 mg via ORAL

## 2015-06-27 MED ORDER — HYDROMORPHONE HCL 1 MG/ML IJ SOLN
INTRAMUSCULAR | Status: AC
  Filled 2015-06-27: qty 1

## 2015-06-27 MED ORDER — DEXAMETHASONE SODIUM PHOSPHATE 10 MG/ML IJ SOLN
INTRAMUSCULAR | Status: AC
  Filled 2015-06-27: qty 1

## 2015-06-27 MED ORDER — CEFAZOLIN SODIUM-DEXTROSE 2-3 GM-% IV SOLR
INTRAVENOUS | Status: AC
  Filled 2015-06-27: qty 50

## 2015-06-27 MED ORDER — PROPOFOL 500 MG/50ML IV EMUL
INTRAVENOUS | Status: DC | PRN
  Administered 2015-06-27: 25 ug/kg/min via INTRAVENOUS

## 2015-06-27 MED ORDER — GLYCOPYRROLATE 0.2 MG/ML IJ SOLN
INTRAMUSCULAR | Status: AC
  Filled 2015-06-27: qty 1

## 2015-06-27 MED ORDER — MIDAZOLAM HCL 2 MG/2ML IJ SOLN: 1.0000 mg | INTRAMUSCULAR | Status: DC | PRN

## 2015-06-27 SURGICAL SUPPLY — 41 items
APPLICATOR COTTON TIP 6IN STRL (MISCELLANEOUS) ×2 IMPLANT
BLADE HOOK ENDO STRL (BLADE) ×2 IMPLANT
BLADE SURG 15 STRL LF DISP TIS (BLADE) ×1 IMPLANT
BLADE SURG 15 STRL SS (BLADE) ×1
BLADE TRIANGLE EPF/EGR ENDO (BLADE) ×2 IMPLANT
BNDG COHESIVE 4X5 TAN STRL (GAUZE/BANDAGES/DRESSINGS) ×2 IMPLANT
BNDG ESMARK 4X9 LF (GAUZE/BANDAGES/DRESSINGS) ×2 IMPLANT
BNDG GAUZE ELAST 4 BULKY (GAUZE/BANDAGES/DRESSINGS) ×2 IMPLANT
CHLORAPREP W/TINT 26ML (MISCELLANEOUS) ×2 IMPLANT
CORDS BIPOLAR (ELECTRODE) IMPLANT
COVER BACK TABLE 60X90IN (DRAPES) ×2 IMPLANT
COVER MAYO STAND STRL (DRAPES) ×2 IMPLANT
CUFF TOURNIQUET SINGLE 18IN (TOURNIQUET CUFF) ×2 IMPLANT
DRAIN TLS ROUND 10FR (DRAIN) ×2 IMPLANT
DRAPE EXTREMITY T 121X128X90 (DRAPE) ×2 IMPLANT
DRAPE SURG 17X23 STRL (DRAPES) ×2 IMPLANT
DRSG EMULSION OIL 3X3 NADH (GAUZE/BANDAGES/DRESSINGS) ×2 IMPLANT
GLOVE BIO SURGEON STRL SZ7.5 (GLOVE) ×2 IMPLANT
GLOVE BIOGEL PI IND STRL 7.0 (GLOVE) ×2 IMPLANT
GLOVE BIOGEL PI IND STRL 8 (GLOVE) ×1 IMPLANT
GLOVE BIOGEL PI INDICATOR 7.0 (GLOVE) ×2
GLOVE BIOGEL PI INDICATOR 8 (GLOVE) ×1
GLOVE ECLIPSE 6.5 STRL STRAW (GLOVE) ×2 IMPLANT
GLOVE SURG SS PI 6.5 STRL IVOR (GLOVE) ×2 IMPLANT
GOWN STRL REUS W/ TWL LRG LVL3 (GOWN DISPOSABLE) ×2 IMPLANT
GOWN STRL REUS W/TWL LRG LVL3 (GOWN DISPOSABLE) ×2
GOWN STRL REUS W/TWL XL LVL3 (GOWN DISPOSABLE) ×2 IMPLANT
NEEDLE HYPO 25X1 1.5 SAFETY (NEEDLE) IMPLANT
NS IRRIG 1000ML POUR BTL (IV SOLUTION) ×2 IMPLANT
PACK BASIN DAY SURGERY FS (CUSTOM PROCEDURE TRAY) ×2 IMPLANT
PADDING CAST ABS 4INX4YD NS (CAST SUPPLIES)
PADDING CAST ABS COTTON 4X4 ST (CAST SUPPLIES) IMPLANT
SPONGE GAUZE 4X4 12PLY STER LF (GAUZE/BANDAGES/DRESSINGS) ×2 IMPLANT
STOCKINETTE 6  STRL (DRAPES) ×1
STOCKINETTE 6 STRL (DRAPES) ×1 IMPLANT
SUT VICRYL RAPIDE 4-0 (SUTURE) ×2 IMPLANT
SYR BULB 3OZ (MISCELLANEOUS) ×2 IMPLANT
SYRINGE 10CC LL (SYRINGE) IMPLANT
TOWEL OR 17X24 6PK STRL BLUE (TOWEL DISPOSABLE) ×4 IMPLANT
TOWEL OR NON WOVEN STRL DISP B (DISPOSABLE) ×2 IMPLANT
UNDERPAD 30X30 (UNDERPADS AND DIAPERS) ×2 IMPLANT

## 2015-06-27 NOTE — Transfer of Care (Signed)
Immediate Anesthesia Transfer of Care Note  Patient: Sylvia Acevedo  Procedure(s) Performed: Procedure(s): LEFT ENDOSCOPIC CARPAL TUNNEL RELEASE  (Left)  Patient Location: PACU  Anesthesia Type:MAC  Level of Consciousness: awake and oriented  Airway & Oxygen Therapy: Patient Spontanous Breathing and Patient connected to face mask oxygen  Post-op Assessment: Report given to RN and Post -op Vital signs reviewed and stable  Post vital signs: Reviewed and stable  Last Vitals:  Filed Vitals:   06/27/15 1234  BP: 111/72  Pulse: 90  Temp: 36.9 C  Resp: 16    Complications: No apparent anesthesia complications

## 2015-06-27 NOTE — Discharge Instructions (Signed)
Discharge Instructions ° ° °You have a light dressing on your hand.  °You may begin gentle motion of your fingers and hand immediately, but you should not do any heavy lifting or gripping.  Elevate your hand to reduce pain & swelling of the digits.  Ice over the operative site may be helpful to reduce pain & swelling.  DO NOT USE HEAT. °Pain medicine has been prescribed for you.  °Use your medicine as needed over the first 48 hours, and then you can begin to taper your use. You may use Tylenol in place of your prescribed pain medication, but not IN ADDITION to it. °Leave the dressing in place until the third day after your surgery and then remove it, leaving it open to air.  °After the bandage has been removed you may shower, but do not soak the incision.  °You may drive a car when you are off of prescription pain medications and can safely control your vehicle with both hands. °We will address whether therapy will be required or not when you return to the office. °You may have already made your follow-up appointment when we completed your preop visit.  If not, please call our office today or the next business day to make your return appointment for 10-15 days after surgery. ° ° °Please call [phone] during normal business hours or [phone] after hours for any problems. Including the following: ° °- excessive redness of the incisions °- drainage for more than 4 days °- fever of more than 101.5 F ° °*Please note that pain medications will not be refilled after hours or on weekends. ° ° °TLS Drain Instructions °You have a drain tube in place to help limit the amount of blood that collects under your skin in the early post-operative period.  The amount it drains will vary from person-to-person and is dependent upon many factors.  You will have 1-2 extra drain tubes sent with you from the facility.  You should change the tube according to these instructions below when the tube is about half full. ° °BE SURE TO  SLIDE THE CLAMP TO THE CLOSED POSITION BEFORE CHANGING THE TUBE.   ° °RE-OPEN THE CLAMP ONCE THE GLASS EVACUATION TUBE HAS BEEN CHANGED. ° °24 hours after surgery the amount of drainage will likely be negligible and it will be time to remove the tube and discard it.  Simply remove the tape that secures the flexible plastic drainage tube to the bandage and briskly pull the tube straight out.  You will likely feel a little discomfort during this process, but the tube should slide out as it is not sewn or otherwise secured directly to your body.  It is merely held in place by the bandage itself.  °                           ° °If you are not clear about when your first post-op appointment is scheduled, please call the office on the next business day to inquire about it.  Please also call the office if you have any other questions ([phone]).   ° °Post Anesthesia Home Care Instructions ° °Activity: °Get plenty of rest for the remainder of the day. A responsible adult should stay with you for 24 hours following the procedure.  °For the next 24 hours, DO NOT: °-Drive a car °-Operate machinery °-Drink alcoholic beverages °-Take any medication unless instructed by your physician °-Make any legal decisions or   decisions or sign important papers. ° °Meals: °Start with liquid foods such as gelatin or soup. Progress to regular foods as tolerated. Avoid greasy, spicy, heavy foods. If nausea and/or vomiting occur, drink only clear liquids until the nausea and/or vomiting subsides. Call your physician if vomiting continues. ° °Special Instructions/Symptoms: °Your throat may feel dry or sore from the anesthesia or the breathing tube placed in your throat during surgery. If this causes discomfort, gargle with warm salt water. The discomfort should disappear within 24 hours. ° °If you had a scopolamine patch placed behind your ear for the management of post- operative nausea and/or vomiting: ° °1. The medication in the patch is effective  for 72 hours, after which it should be removed.  Wrap patch in a tissue and discard in the trash. Wash hands thoroughly with soap and water. °2. You may remove the patch earlier than 72 hours if you experience unpleasant side effects which may include dry mouth, dizziness or visual disturbances. °3. Avoid touching the patch. Wash your hands with soap and water after contact with the patch. °  ° °

## 2015-06-27 NOTE — Anesthesia Preprocedure Evaluation (Signed)
Anesthesia Evaluation  Patient identified by MRN, date of birth, ID band Patient awake    Reviewed: Allergy & Precautions, NPO status , Patient's Chart, lab work & pertinent test results  Airway Mallampati: II  TM Distance: >3 FB Neck ROM: Full    Dental   Pulmonary neg pulmonary ROS,    breath sounds clear to auscultation       Cardiovascular negative cardio ROS   Rhythm:Regular Rate:Normal     Neuro/Psych  Headaches, PSYCHIATRIC DISORDERS Anxiety    GI/Hepatic negative GI ROS, (+) Hepatitis -  Endo/Other  negative endocrine ROS  Renal/GU negative Renal ROS     Musculoskeletal  (+) Arthritis ,   Abdominal   Peds  Hematology negative hematology ROS (+)   Anesthesia Other Findings   Reproductive/Obstetrics                             Anesthesia Physical  Anesthesia Plan  ASA: II  Anesthesia Plan: MAC   Post-op Pain Management:    Induction: Intravenous  Airway Management Planned: Natural Airway and Simple Face Mask  Additional Equipment:   Intra-op Plan:   Post-operative Plan:   Informed Consent: I have reviewed the patients History and Physical, chart, labs and discussed the procedure including the risks, benefits and alternatives for the proposed anesthesia with the patient or authorized representative who has indicated his/her understanding and acceptance.   Dental advisory given  Plan Discussed with: CRNA  Anesthesia Plan Comments:         Anesthesia Quick Evaluation

## 2015-06-27 NOTE — Anesthesia Postprocedure Evaluation (Signed)
Anesthesia Post Note  Patient: Sylvia Acevedo  Procedure(s) Performed: Procedure(s) (LRB): LEFT ENDOSCOPIC CARPAL TUNNEL RELEASE  (Left)  Anesthesia type: MAC  Patient location: PACU  Post pain: Pain level controlled  Post assessment: Post-op Vital signs reviewed  Last Vitals: BP 112/75 mmHg  Pulse 82  Temp(Src) 37.1 C (Oral)  Resp 13  Ht 5\' 2"  (1.575 m)  Wt 147 lb (66.679 kg)  BMI 26.88 kg/m2  SpO2 100%  Post vital signs: Reviewed  Level of consciousness: awake  Complications: No apparent anesthesia complications

## 2015-06-27 NOTE — Op Note (Signed)
06/27/2015  12:34 PM  PATIENT:  Sylvia Acevedo  44 y.o. female  PRE-OPERATIVE DIAGNOSIS:  LEFT CARPAL TUNNEL SYNDROME  POST-OPERATIVE DIAGNOSIS:  Same  PROCEDURE:  Procedure(s): LEFT ENDOSCOPIC CARPAL TUNNEL RELEASE   SURGEON:  Surgeon(s): Milly Jakob, MD  PHYSICIAN ASSISTANT: Morley Kos, OPA-C  ANESTHESIA:  Local / MAC  SPECIMENS:  None  DRAINS:   TLS x 1  EBL:  less than 50 mL  PREOPERATIVE INDICATIONS:  Sylvia Acevedo is a  44 y.o. female with a diagnosis of LEFT CARPAL TUNNEL SYNDROME who failed conservative measures and elected for surgical management.    The risks benefits and alternatives were discussed with the patient preoperatively including but not limited to the risks of infection, bleeding, nerve injury, cardiopulmonary complications, the need for revision surgery, among others, and the patient verbalized understanding and consented to proceed.  OPERATIVE IMPLANTS: None  OPERATIVE FINDINGS: Tight carpal tunnel, acceptably decompressed following release  OPERATIVE PROCEDURE:  After receiving prophylactic antibiotics, the patient was escorted to the operative theatre and placed in a supine position.   A surgical "time-out" was performed during which the planned procedure, proposed operative site, and the correct patient identity were compared to the operative consent and agreement confirmed by the circulating nurse according to current facility policy.  Following application of a tourniquet to the operative extremity, the planned incisions were marked and anesthetized with a mixture of lidocaine & bupivicaine containing epinephrine.  The exposed skin was prepped with Chloraprep and draped in the usual sterile fashion.  The limb was exsanguinated with an Esmarch bandage and the tourniquet inflated to approximately 135mmHg higher than systolic BP.   The incisions were made sharply. Subcutaneous tissues were dissected with blunt and spreading dissection. At the  proximal incision, the deep forearm fascia was split in line with the skin incision the distal edge grasped with a hemostat. At the mid palmar incision, the palmar fascia was split in line with the skin incision, revealing the underlying superficial palmar arch which was visualized with loupe assisted magnification. The synovial reflector was then introduced into the proximal incision and passed through the carpal canal, uses to reflect synovium from the deep surface of the transverse carpal ligament. It was removed and replaced with the slotted cannula and blunt obturator, passing from proximal to distal and exiting the distal wound superficial to the superficial palmar arch. The obturator was removed and the camera inserted. Visualization of the ligament was acceptable. The triangle shape blade was then inserted distally, advanced to the midportion of the ligament, and there used to create a perforation in the ligament. This instrument was removed and the hooked nstrument was inserted. It was placed into the perforation in the ligament and withdrawn distally, completing transection of the distal half of the ligament. The camera was then removed and placed into the distal end of the cannula. The hooked instrument was placed into the proximal end and advanced facility to be placed into the apex of the V. which had been formed to the distal hemi-transection of the ligament. It was withdrawn proximally, completing transection of the ligament. The adequacy of the release was judged with the scope and the instruments used as a probe. All of the endoscopic instruments are removed and the adequacy of release was again judged from the proximal incision perspective with direct loupe assisted visualization. In addition, the proximal forearm fascia was split for 2 inches proximal to the proximal incision under direct visualization using a sliding scissor technique.  The tourniquet was released, the wound copiously irrigated. A  TLS drain was placed to lie alongside the nerve exiting its own skin hole distally made with some sharp trocar. The skin was then closed with 4-0 Vicryl Rapide interrupted sutures. A light dressing was applied and the balance of 10 mL of the initial anesthetic mixture was placed down the drain, and the drain was clamped to be unclamped in the recovery room.  DISPOSITION:  The patient will be discharged home today, returning in 10-15 days for re-assessment.

## 2015-06-27 NOTE — Interval H&P Note (Signed)
History and Physical Interval Note:  06/27/2015 12:34 PM  Sylvia Acevedo  has presented today for surgery, with the diagnosis of LEFT CARPAL TUNNEL SYNDROME  The various methods of treatment have been discussed with the patient and family. After consideration of risks, benefits and other options for treatment, the patient has consented to  Procedure(s): LEFT ENDOSCOPIC CARPAL TUNNEL RELEASE  (Left) as a surgical intervention .  The patient's history has been reviewed, patient examined, no change in status, stable for surgery.  I have reviewed the patient's chart and labs.  Questions were answered to the patient's satisfaction.     Erico Stan A.

## 2015-06-27 NOTE — Anesthesia Procedure Notes (Signed)
Procedure Name: MAC Date/Time: 06/27/2015 1:52 PM Performed by: Marrianne Mood Pre-anesthesia Checklist: Patient identified, Timeout performed, Emergency Drugs available, Suction available and Patient being monitored Patient Re-evaluated:Patient Re-evaluated prior to inductionOxygen Delivery Method: Simple face mask

## 2015-06-28 ENCOUNTER — Encounter (HOSPITAL_BASED_OUTPATIENT_CLINIC_OR_DEPARTMENT_OTHER): Payer: Self-pay | Admitting: Orthopedic Surgery
